# Patient Record
Sex: Female | Born: 1960 | Race: White | Hispanic: No | Marital: Married | Smoking: Former smoker
Health system: Southern US, Community
[De-identification: ages and names within clinical notes are randomized; demographics above are authoritative.]

## PROBLEM LIST (undated history)

## (undated) DIAGNOSIS — N390 Urinary tract infection, site not specified: Secondary | ICD-10-CM

## (undated) DIAGNOSIS — R109 Unspecified abdominal pain: Secondary | ICD-10-CM

## (undated) DIAGNOSIS — E079 Disorder of thyroid, unspecified: Secondary | ICD-10-CM

## (undated) DIAGNOSIS — K430 Incisional hernia with obstruction, without gangrene: Secondary | ICD-10-CM

## (undated) DIAGNOSIS — K56609 Unspecified intestinal obstruction, unspecified as to partial versus complete obstruction: Secondary | ICD-10-CM

## (undated) DIAGNOSIS — F419 Anxiety disorder, unspecified: Secondary | ICD-10-CM

## (undated) HISTORY — DX: Urinary tract infection, site not specified: N39.0

## (undated) HISTORY — PX: BLADDER SUSPENSION: SHX72

## (undated) HISTORY — PX: HERNIA REPAIR: SHX51

## (undated) HISTORY — DX: Unspecified abdominal pain: R10.9

## (undated) HISTORY — PX: ABDOMINAL SURGERY: SHX537

---

## 2001-05-15 ENCOUNTER — Other Ambulatory Visit: Admission: RE | Admit: 2001-05-15 | Discharge: 2001-05-15 | Payer: Self-pay | Admitting: Family Medicine

## 2003-01-28 ENCOUNTER — Other Ambulatory Visit: Admission: RE | Admit: 2003-01-28 | Discharge: 2003-01-28 | Payer: Self-pay | Admitting: Family Medicine

## 2003-11-08 ENCOUNTER — Encounter: Admission: RE | Admit: 2003-11-08 | Discharge: 2003-11-08 | Payer: Self-pay | Admitting: Obstetrics and Gynecology

## 2005-01-14 HISTORY — PX: ABDOMINAL HYSTERECTOMY: SHX81

## 2005-03-28 ENCOUNTER — Other Ambulatory Visit: Admission: RE | Admit: 2005-03-28 | Discharge: 2005-03-28 | Payer: Self-pay | Admitting: Obstetrics and Gynecology

## 2005-08-20 ENCOUNTER — Inpatient Hospital Stay (HOSPITAL_COMMUNITY): Admission: RE | Admit: 2005-08-20 | Discharge: 2005-09-03 | Payer: Self-pay | Admitting: Obstetrics and Gynecology

## 2005-08-26 ENCOUNTER — Encounter: Payer: Self-pay | Admitting: General Surgery

## 2008-01-02 IMAGING — US IR US GUIDE VASC ACCESS RIGHT
1 series · 1 of 1 positions shown · non-contrast
Comparison: none

CLINICAL DATA: Small bowel obstruction.  PICC has been requested for TNA.
 PICC PLACEMENT WITH FLUOROSCOPIC AND ULTRASOUND GUIDANCE:
TECHNIQUE: The right arm was prepped with Betadine, draped in the usual sterile fashion, and infiltrated locally with 1% lidocaine.  Ultrasound demonstrated patency of the right basilic vein.  Under real-time ultrasound guidance, this vein was accessed with a 21-gauge micropuncture needle.  The needle was exchanged over a guidewire for a peel-away sheath, through which a 5 French dual lumen PICC catheter trimmed to 37 cm was advanced, positioned with its tip at the distal SVC/right atrial junction.  Fluoroscopic spot chest radiograph confirms appropriate catheter tip position.  The catheter was flushed, secured to the skin with Prolene sutures, and covered with a sterile dressing.  No immediate complication.

[Series 1: sp us guide vasc access*right* · 1 of 1 slices shown]
[im 1/1]
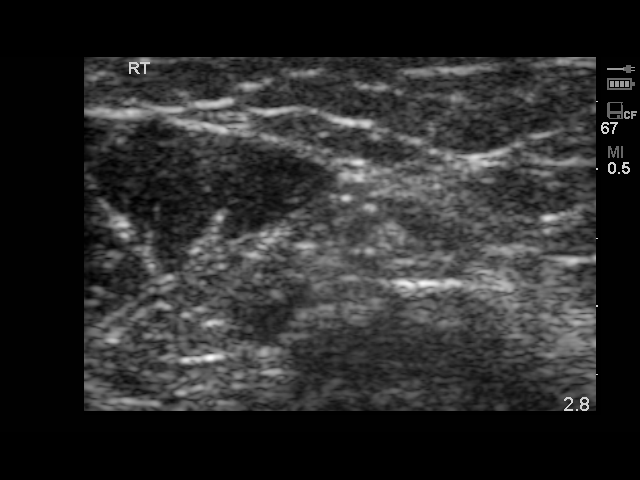

[1 of 1 positions shown; findings below may reference images not displayed]

IMPRESSION: Technically successful right arm PICC placement with ultrasound.  Ready for routine use.

## 2010-01-14 HISTORY — PX: APPENDECTOMY: SHX54

## 2010-02-04 ENCOUNTER — Encounter: Payer: Self-pay | Admitting: Family Medicine

## 2010-11-20 ENCOUNTER — Ambulatory Visit (INDEPENDENT_AMBULATORY_CARE_PROVIDER_SITE_OTHER)
Admission: RE | Admit: 2010-11-20 | Discharge: 2010-11-20 | Disposition: A | Discharge status: Home / Self Care | Payer: 59 | Source: Ambulatory Visit | Attending: Family Medicine | Admitting: Family Medicine

## 2010-11-20 ENCOUNTER — Other Ambulatory Visit (HOSPITAL_BASED_OUTPATIENT_CLINIC_OR_DEPARTMENT_OTHER): Payer: Self-pay | Admitting: Family Medicine

## 2010-11-20 DIAGNOSIS — R110 Nausea: Secondary | ICD-10-CM

## 2010-11-20 DIAGNOSIS — K7689 Other specified diseases of liver: Secondary | ICD-10-CM

## 2010-11-20 DIAGNOSIS — R509 Fever, unspecified: Secondary | ICD-10-CM

## 2010-11-20 DIAGNOSIS — R1084 Generalized abdominal pain: Secondary | ICD-10-CM

## 2010-11-20 MED ORDER — IOHEXOL 300 MG/ML  SOLN
100.0000 mL | Freq: Once | INTRAMUSCULAR | Status: AC | PRN
  Administered 2010-11-20: 100 mL via INTRAVENOUS

## 2010-11-21 ENCOUNTER — Encounter (HOSPITAL_COMMUNITY): Payer: Self-pay

## 2010-11-21 ENCOUNTER — Emergency Department (HOSPITAL_COMMUNITY): Payer: 59 | Admitting: Anesthesiology

## 2010-11-21 ENCOUNTER — Inpatient Hospital Stay (HOSPITAL_COMMUNITY)
Admission: EM | Admit: 2010-11-21 | Discharge: 2010-11-22 | DRG: 343 | Disposition: A | Discharge status: Home / Self Care | Payer: 59 | Attending: Surgery | Admitting: Surgery

## 2010-11-21 ENCOUNTER — Encounter (HOSPITAL_COMMUNITY): Payer: Self-pay | Admitting: Anesthesiology

## 2010-11-21 ENCOUNTER — Telehealth (INDEPENDENT_AMBULATORY_CARE_PROVIDER_SITE_OTHER): Payer: Self-pay | Admitting: General Surgery

## 2010-11-21 ENCOUNTER — Other Ambulatory Visit (INDEPENDENT_AMBULATORY_CARE_PROVIDER_SITE_OTHER): Payer: Self-pay | Admitting: General Surgery

## 2010-11-21 ENCOUNTER — Encounter (HOSPITAL_COMMUNITY)
Admission: EM | Disposition: A | Discharge status: Home / Self Care | Payer: Self-pay | Source: Home / Self Care | Attending: General Surgery

## 2010-11-21 ENCOUNTER — Encounter (HOSPITAL_COMMUNITY): Payer: Self-pay | Admitting: General Surgery

## 2010-11-21 DIAGNOSIS — K3580 Unspecified acute appendicitis: Principal | ICD-10-CM | POA: Diagnosis present

## 2010-11-21 DIAGNOSIS — E039 Hypothyroidism, unspecified: Secondary | ICD-10-CM | POA: Diagnosis present

## 2010-11-21 DIAGNOSIS — K37 Unspecified appendicitis: Secondary | ICD-10-CM | POA: Diagnosis present

## 2010-11-21 DIAGNOSIS — R1031 Right lower quadrant pain: Secondary | ICD-10-CM

## 2010-11-21 DIAGNOSIS — F411 Generalized anxiety disorder: Secondary | ICD-10-CM | POA: Diagnosis present

## 2010-11-21 HISTORY — DX: Anxiety disorder, unspecified: F41.9

## 2010-11-21 HISTORY — PX: LAPAROSCOPIC APPENDECTOMY: SHX408

## 2010-11-21 HISTORY — DX: Disorder of thyroid, unspecified: E07.9

## 2010-11-21 HISTORY — DX: Incisional hernia with obstruction, without gangrene: K43.0

## 2010-11-21 HISTORY — DX: Unspecified intestinal obstruction, unspecified as to partial versus complete obstruction: K56.609

## 2010-11-21 SURGERY — APPENDECTOMY, LAPAROSCOPIC
Anesthesia: General | Site: Abdomen | Wound class: Contaminated

## 2010-11-21 MED ORDER — PROMETHAZINE HCL 25 MG/ML IJ SOLN
6.2500 mg | INTRAMUSCULAR | Status: DC | PRN
  Filled 2010-11-21: qty 1

## 2010-11-21 MED ORDER — RINGERS IRRIGATION IR SOLN
Status: DC | PRN
  Administered 2010-11-21: 1000 mL

## 2010-11-21 MED ORDER — LIDOCAINE HCL (CARDIAC) 20 MG/ML IV SOLN
INTRAVENOUS | Status: DC | PRN
  Administered 2010-11-21: 100 mg via INTRAVENOUS

## 2010-11-21 MED ORDER — HYDROMORPHONE HCL PF 1 MG/ML IJ SOLN
0.2500 mg | INTRAMUSCULAR | Status: DC | PRN
  Administered 2010-11-21: 0.5 mg via INTRAVENOUS

## 2010-11-21 MED ORDER — ONDANSETRON HCL 4 MG/2ML IJ SOLN
4.0000 mg | Freq: Four times a day (QID) | INTRAMUSCULAR | Status: DC | PRN
  Administered 2010-11-21 – 2010-11-22 (×2): 4 mg via INTRAVENOUS
  Filled 2010-11-21 (×3): qty 2

## 2010-11-21 MED ORDER — CISATRACURIUM BESYLATE 2 MG/ML IV SOLN
INTRAVENOUS | Status: DC | PRN
  Administered 2010-11-21: 5 mg via INTRAVENOUS

## 2010-11-21 MED ORDER — SODIUM CHLORIDE 0.9 % IR SOLN
Status: DC | PRN
  Administered 2010-11-21: 1000 mL

## 2010-11-21 MED ORDER — MIDAZOLAM HCL 5 MG/5ML IJ SOLN
INTRAMUSCULAR | Status: DC | PRN
  Administered 2010-11-21: 2 mg via INTRAVENOUS

## 2010-11-21 MED ORDER — LACTATED RINGERS IV SOLN
INTRAVENOUS | Status: DC | PRN
  Administered 2010-11-21 (×2): via INTRAVENOUS

## 2010-11-21 MED ORDER — ACETAMINOPHEN 325 MG PO TABS: 650.0000 mg | ORAL_TABLET | Freq: Four times a day (QID) | ORAL | Status: DC | PRN

## 2010-11-21 MED ORDER — SODIUM CHLORIDE 0.9 % IV SOLN
3.0000 g | Freq: Once | INTRAVENOUS | Status: AC
  Administered 2010-11-21: 3 g via INTRAVENOUS
  Filled 2010-11-21: qty 3

## 2010-11-21 MED ORDER — BUPIVACAINE HCL (PF) 0.5 % IJ SOLN
INTRAMUSCULAR | Status: DC | PRN
  Administered 2010-11-21: 15 mL

## 2010-11-21 MED ORDER — SUFENTANIL CITRATE 50 MCG/ML IV SOLN
INTRAVENOUS | Status: DC | PRN
  Administered 2010-11-21: 20 ug via INTRAVENOUS
  Administered 2010-11-21: 5 ug via INTRAVENOUS

## 2010-11-21 MED ORDER — HYDROMORPHONE HCL PF 1 MG/ML IJ SOLN
INTRAMUSCULAR | Status: AC
  Filled 2010-11-21: qty 1

## 2010-11-21 MED ORDER — SUCCINYLCHOLINE CHLORIDE 20 MG/ML IJ SOLN
INTRAMUSCULAR | Status: DC | PRN
  Administered 2010-11-21: 80 mg via INTRAVENOUS

## 2010-11-21 MED ORDER — MORPHINE SULFATE 2 MG/ML IJ SOLN
1.0000 mg | INTRAMUSCULAR | Status: DC | PRN
  Administered 2010-11-21 – 2010-11-22 (×3): 2 mg via INTRAVENOUS
  Filled 2010-11-21 (×3): qty 1

## 2010-11-21 MED ORDER — KCL IN DEXTROSE-NACL 20-5-0.45 MEQ/L-%-% IV SOLN
INTRAVENOUS | Status: DC
  Administered 2010-11-21 – 2010-11-22 (×2): via INTRAVENOUS
  Filled 2010-11-21 (×6): qty 1000

## 2010-11-21 MED ORDER — GLYCOPYRROLATE 0.2 MG/ML IJ SOLN
INTRAMUSCULAR | Status: DC | PRN
  Administered 2010-11-21: .5 mg via INTRAVENOUS

## 2010-11-21 MED ORDER — ACETAMINOPHEN 650 MG RE SUPP: 650.0000 mg | Freq: Four times a day (QID) | RECTAL | Status: DC | PRN

## 2010-11-21 MED ORDER — PANTOPRAZOLE SODIUM 40 MG IV SOLR
40.0000 mg | Freq: Every day | INTRAVENOUS | Status: DC
  Filled 2010-11-21: qty 40

## 2010-11-21 MED ORDER — ACETAMINOPHEN 10 MG/ML IV SOLN
INTRAVENOUS | Status: AC
  Filled 2010-11-21: qty 100

## 2010-11-21 MED ORDER — BUPIVACAINE HCL (PF) 0.5 % IJ SOLN
INTRAMUSCULAR | Status: AC
  Filled 2010-11-21: qty 30

## 2010-11-21 MED ORDER — ACETAMINOPHEN 10 MG/ML IV SOLN
INTRAVENOUS | Status: DC | PRN
  Administered 2010-11-21: 1000 mg via INTRAVENOUS

## 2010-11-21 MED ORDER — NEOSTIGMINE METHYLSULFATE 1 MG/ML IJ SOLN
INTRAMUSCULAR | Status: DC | PRN
  Administered 2010-11-21: 3.5 mg via INTRAVENOUS

## 2010-11-21 MED ORDER — PROPOFOL 10 MG/ML IV EMUL
INTRAVENOUS | Status: DC | PRN
  Administered 2010-11-21: 170 mg via INTRAVENOUS

## 2010-11-21 MED ORDER — EPHEDRINE SULFATE 50 MG/ML IJ SOLN
INTRAMUSCULAR | Status: DC | PRN
  Administered 2010-11-21 (×2): 10 mg via INTRAVENOUS

## 2010-11-21 MED ORDER — ONDANSETRON HCL 4 MG/2ML IJ SOLN
INTRAMUSCULAR | Status: DC | PRN
  Administered 2010-11-21: 4 mg via INTRAVENOUS

## 2010-11-21 SURGICAL SUPPLY — 51 items
APL SKNCLS STERI-STRIP NONHPOA (GAUZE/BANDAGES/DRESSINGS) ×1
APPLIER CLIP 5 13 M/L LIGAMAX5 (MISCELLANEOUS)
APPLIER CLIP ROT 10 11.4 M/L (STAPLE)
APR CLP MED LRG 11.4X10 (STAPLE)
APR CLP MED LRG 5 ANG JAW (MISCELLANEOUS)
BAG SPEC RTRVL LRG 6X4 10 (ENDOMECHANICALS) ×1
BENZOIN TINCTURE PRP APPL 2/3 (GAUZE/BANDAGES/DRESSINGS) ×2 IMPLANT
CANISTER SUCTION 2500CC (MISCELLANEOUS) ×2 IMPLANT
CHLORAPREP W/TINT 26ML (MISCELLANEOUS) ×2 IMPLANT
CLIP APPLIE 5 13 M/L LIGAMAX5 (MISCELLANEOUS) IMPLANT
CLIP APPLIE ROT 10 11.4 M/L (STAPLE) IMPLANT
CLOTH BEACON ORANGE TIMEOUT ST (SAFETY) ×2 IMPLANT
CUTTER FLEX LINEAR 45M (STAPLE) ×1 IMPLANT
DECANTER SPIKE VIAL GLASS SM (MISCELLANEOUS) ×2 IMPLANT
DEVICE TROCAR PUNCTURE CLOSURE (ENDOMECHANICALS) ×1 IMPLANT
DRAPE LAPAROSCOPIC ABDOMINAL (DRAPES) ×2 IMPLANT
DRAPE UTILITY XL STRL (DRAPES) ×2 IMPLANT
DRSG TEGADERM 2-3/8X2-3/4 SM (GAUZE/BANDAGES/DRESSINGS) ×10 IMPLANT
DRSG TEGADERM 2.38X2.75 (GAUZE/BANDAGES/DRESSINGS) ×4 IMPLANT
ELECT REM PT RETURN 9FT ADLT (ELECTROSURGICAL) ×2
ELECTRODE REM PT RTRN 9FT ADLT (ELECTROSURGICAL) ×1 IMPLANT
ENDOLOOP SUT PDS II  0 18 (SUTURE)
ENDOLOOP SUT PDS II 0 18 (SUTURE) IMPLANT
GAUZE SPONGE 2X2 8PLY STRL LF (GAUZE/BANDAGES/DRESSINGS) IMPLANT
GLOVE BIOGEL PI IND STRL 7.0 (GLOVE) ×1 IMPLANT
GLOVE BIOGEL PI INDICATOR 7.0 (GLOVE) ×1
GLOVE ECLIPSE 8.0 STRL XLNG CF (GLOVE) ×2 IMPLANT
GLOVE INDICATOR 8.0 STRL GRN (GLOVE) ×4 IMPLANT
GOWN STRL NON-REIN LRG LVL3 (GOWN DISPOSABLE) ×2 IMPLANT
GOWN STRL REIN XL XLG (GOWN DISPOSABLE) ×3 IMPLANT
KIT BASIN OR (CUSTOM PROCEDURE TRAY) ×2 IMPLANT
PENCIL BUTTON HOLSTER BLD 10FT (ELECTRODE) ×2 IMPLANT
POUCH SPECIMEN RETRIEVAL 10MM (ENDOMECHANICALS) ×1 IMPLANT
RELOAD 45 VASCULAR/THIN (ENDOMECHANICALS) IMPLANT
RELOAD STAPLE 45 2.5 WHT GRN (ENDOMECHANICALS) IMPLANT
RELOAD STAPLE 45 3.5 BLU ETS (ENDOMECHANICALS) IMPLANT
RELOAD STAPLE TA45 3.5 REG BLU (ENDOMECHANICALS) ×2 IMPLANT
SCALPEL HARMONIC ACE (MISCELLANEOUS) ×1 IMPLANT
SCISSORS LAP 5X35 DISP (ENDOMECHANICALS) ×1 IMPLANT
SET IRRIG TUBING LAPAROSCOPIC (IRRIGATION / IRRIGATOR) ×2 IMPLANT
SOLUTION ANTI FOG 6CC (MISCELLANEOUS) ×2 IMPLANT
SPONGE GAUZE 2X2 STER 10/PKG (GAUZE/BANDAGES/DRESSINGS) ×1
STRIP CLOSURE SKIN 1/2X4 (GAUZE/BANDAGES/DRESSINGS) ×2 IMPLANT
SUT MNCRL AB 4-0 PS2 18 (SUTURE) ×2 IMPLANT
TOWEL OR 17X26 10 PK STRL BLUE (TOWEL DISPOSABLE) ×2 IMPLANT
TRAY FOLEY CATH 14FRSI W/METER (CATHETERS) ×2 IMPLANT
TRAY LAP CHOLE (CUSTOM PROCEDURE TRAY) ×2 IMPLANT
TROCAR BLADELESS OPT 5 75 (ENDOMECHANICALS) ×5 IMPLANT
TROCAR XCEL 12X100 BLDLESS (ENDOMECHANICALS) ×1 IMPLANT
TROCAR XCEL BLUNT TIP 100MML (ENDOMECHANICALS) IMPLANT
TUBING INSUFFLATION 10FT LAP (TUBING) ×2 IMPLANT

## 2010-11-21 NOTE — Transfer of Care (Signed)
Immediate Anesthesia Transfer of Care Note  Patient: Theresa Rich  Procedure(s) Performed:  APPENDECTOMY LAPAROSCOPIC  Patient Location: PACU  Anesthesia Type: General  Level of Consciousness: awake and oriented  Airway & Oxygen Therapy: Patient Spontanous Breathing and Patient connected to face mask oxygen  Post-op Assessment: Report given to PACU RN and Post -op Vital signs reviewed and stable  Post vital signs: Reviewed and stable  Complications: No apparent anesthesia complications

## 2010-11-21 NOTE — Progress Notes (Signed)
Presents to ED for eval by Surgeon Dr. Abbey Chatters.  Surgeon currently at bedside.

## 2010-11-21 NOTE — Anesthesia Preprocedure Evaluation (Addendum)
Anesthesia Evaluation  Patient identified by MRN, date of birth, ID band Patient awake    Reviewed: Allergy & Precautions, H&P , NPO status , Patient's Chart, lab work & pertinent test results  Airway Mallampati: III TM Distance: >3 FB Neck ROM: Full    Dental No notable dental hx. (+) Teeth Intact,    Pulmonary neg pulmonary ROS,  clear to auscultation  Pulmonary exam normal       Cardiovascular neg cardio ROS Regular Normal    Neuro/Psych Negative Neurological ROS  Negative Psych ROS   GI/Hepatic negative GI ROS, Neg liver ROS,   Endo/Other  Negative Endocrine ROSHypothyroidism   Renal/GU negative Renal ROS  Genitourinary negative   Musculoskeletal negative musculoskeletal ROS (+)   Abdominal (+) obese,   Peds negative pediatric ROS (+)  Hematology negative hematology ROS (+)   Anesthesia Other Findings   Reproductive/Obstetrics negative OB ROS                         Anesthesia Physical Anesthesia Plan  ASA: II  Anesthesia Plan: General   Post-op Pain Management:    Induction: Intravenous  Airway Management Planned: Oral ETT  Additional Equipment:   Intra-op Plan:   Post-operative Plan: Extubation in OR  Informed Consent: I have reviewed the patients History and Physical, chart, labs and discussed the procedure including the risks, benefits and alternatives for the proposed anesthesia with the patient or authorized representative who has indicated his/her understanding and acceptance.   Dental advisory given  Plan Discussed with: CRNA  Anesthesia Plan Comments:         Anesthesia Quick Evaluation

## 2010-11-21 NOTE — Preoperative (Signed)
Beta Blockers   Reason not to administer Beta Blockers:Not Applicable 

## 2010-11-21 NOTE — Telephone Encounter (Signed)
DR. Derrell Lolling CALLED AFTER SPEAKING WITH DR. Foy Guadalajara RE HAVING A PT SEEN IN URGENT OFFICE TODAY WITH APPENDICITIS. PT IS Theresa Rich. DR. Derrell Lolling WAS ADVISED THAT URGENT OFFICE IS FULL AND SUBSEQUENTLY DR. Biagio Quint CALLED TO SAY HE WOULD BE LATE TO OFFICE. NO INDICATION OF TIME EXPECTED. I REVIEWED CT REPORT WITH DR. Abbey Chatters [facility] AND HE SAID PT NEEDED TO BE SENT TO [place] EMERGENCY ROOM. SHANNON AT DR. Pablo Lawrence OFFICE [phone] NOTIFIED TO DIRECT PT TO [facility]. ER TRIAGE NOTIFIED OF PT'S ARRIVAL.

## 2010-11-21 NOTE — Anesthesia Postprocedure Evaluation (Signed)
  Anesthesia Post-op Note  Patient: Theresa Rich  Procedure(s) Performed:  APPENDECTOMY LAPAROSCOPIC  Patient Location: PACU  Anesthesia Type: General  Level of Consciousness: awake and alert   Airway and Oxygen Therapy: Patient Spontanous Breathing  Post-op Pain: mild  Post-op Assessment: Post-op Vital signs reviewed, Patient's Cardiovascular Status Stable, Respiratory Function Stable, Patent Airway and No signs of Nausea or vomiting  Post-op Vital Signs: stable  Complications: No apparent anesthesia complications

## 2010-11-21 NOTE — H&P (Signed)
[name] is an 50 y.o. female.   Chief Complaint:  HPI:   She began having diffuse abdominal pain approximately 6 days ago. This persisted and on Monday she was seen by her primary care physician. She's had blood work done on Friday which demonstrated Theresa normal white blood cell count and Theresa normal metabolic panel. There is no evidence of urinary tract infection. She underwent Theresa CT scan yesterday which demonstrated dilated appendix with some thickening and mild inflammatory changes consistent with early acute appendicitis. She's had little nausea and low-grade fever but no vomiting. She's not had anything like this before.  Past Medical History  Diagnosis Date  . Anxiety   . Thyroid disease     hypothyroid  . Incisional hernia, incarcerated     Past Surgical History  Procedure Date  . Abdominal hysterectomy   . Hernia repair     incisional with SB resection  . Hernia repair     umbilical as Theresa child  . Bladder suspension     with mesh  . Cesarean section     Medications Prior to Admission  Medication Dose Route Frequency Provider Last Rate Last Dose  . Ampicillin-Sulbactam (UNASYN) 3 g in sodium chloride 0.9 % 100 mL IVPB  3 g Intravenous Once [employer], PA      . dextrose 5 % and 0.45 % NaCl with KCl 20 mEq/L infusion   Intravenous Continuous Alisa Graff Bruning, PA      . iohexol (OMNIPAQUE) 300 MG/ML injection 100 mL  100 mL Intravenous Once PRN Medication Radiologist   100 mL at 11/20/10 1504  . morphine 2 MG/ML injection 1-4 mg  1-4 mg Intravenous Q3H PRN Marianna Fuss, PA      . ondansetron [facility]) injection 4 mg  4 mg Intravenous Q6H PRN Marianna Fuss, PA       No current outpatient prescriptions on file as of 11/21/2010.    Allergies:  Allergies  Allergen Reactions  . Reglan (Metoclopramide Hcl) Other (See Comments)    Felt like things were crawling inside of her.    Family History  Problem Relation Age of Onset  . Heart disease Mother   . Hypertension  Mother   . Heart disease Father   . Hypertension Father   . Hypertension Sister     Social History:  reports that she has quit smoking. She does not have Theresa Rich smokeless tobacco history on file. She reports that she does not drink alcohol. Her drug history not on file.  General:  __Normal _X_Fever__Weight change __Night sweats __Fatigue __Sleep loss  Breast:  _X_Normal __Lump __Pain __Nipple discharge __Infection __Other  Infectious Diseases:  _X_Normal __HIV/AIDS __Tuberculosis __Hepatitis Theresa       __ Hepatitis B __Hepatitis C __ STD __MRSA(staph infection) __Other  Dental:  _X_Normal __Dentures __Other  Cardiac  :_X_ Normal __Pacemaker __Defibrillator __Bypass surgery __High blood pressure __ Peripheral vascular disease __Heart attack __Irregular heart beat __Valve       disease  Pulmonary:  _X_Normal __Cough/Sputum __Bronchitis __Asthma __COPD                    __Short of breath __Pneumonia __Sleep Apnea  Endocrine:  _X_Normal __Diabetes __Thyroid disease __High Cholesterol __Other  Skin:  _X_Normal  __Rash __Bruise easily __Cancer __Abnormal moles __Other  Gastrointestinal:  __Normal __Nausea/Vomiting/Diarrhea __Colon polyp or Cancer       __Irritable Bowel disease __Poor appetite __Hiatal hernia or reflux __Ulcer  __Liver disease _X_Abdominal pain __Hernia __Rectal bleeding or hemorrhoids       __Other  Genitourinary:  _X_Normal __Kidney disease or stones __Prostate problems        __Blood in urine __Difficulty voiding __Incontinence/leakage __Other  Neurological:  _X_Normal __Stroke __Paralysis __Seizure __Alzheimers disease       __Headaches __Dizziness __Fainting __Weakness __Other  Hematologic/Lymphatic:  _X_Normal __Bleeding disorder __Blood clots __Anemia       __Swollen lymph nodes __Blood Transfusion __Other  Immune System:  _X_Normal __Previous/Current Cancer-type:  __Other  HEENT:  _X_Normal __Hearing loss  __Hearing aid __Ear infection __Nose bleed        __Hoarseness __Sore throat __Blurred or double vision __Glasses or contacts       __Glaucoma __Retinopathy __Macular degeneration __Other  Musculoskeletal:  _X_Normal __Joint pain __Arthritis __Other  @V @ General Appearance:  Alert, cooperative, no distress, appears stated age  Head:  Normocephalic, without obvious abnormality, atraumatic  Eyes:  Conjunctiva/corneas clear, EOM's intact      Nose: Nares normal, septum midline no drainage      Neck: Supple, symmetrical, no mass       Lungs:   Clear to auscultation bilaterally, respirations unlabored  Chest Wall:  No tenderness or deformity  Heart:  Regular rate and rhythm, S1, S2 normal, no murmur, rub or gallop  Abdomen:   Soft, mild rlq and suprapubic tenderness, bowel sounds active all four quadrants,  no masses, no organomegaly,transverse midline scars in midabd, lower transverse scar  GU:   no hernias      Extremities: Extremities normal, atraumatic, no cyanosis or edema     Skin: Skin color, texture, turgor normal, no rashes or lesions  Lymph nodes: Cervical, supraclavicular not enlarged  Neurologic: Nonfocal         No results found for this or Theresa Rich previous visit (from the past 48 hour(s)). Ct Abdomen Pelvis W Contrast  11/20/2010  *RADIOLOGY REPORT*  Clinical Data: Generalized abdominal pain, nausea, andfever.  CT ABDOMEN AND PELVIS WITH CONTRAST  Technique:  Multidetector CT imaging of the abdomen and pelvis was performed following the standard protocol during bolus administration of intravenous contrast.  Contrast: OMNIPAQUE IOHEXOL 300 MG/ML IV SOLN  Comparison: Abdominal radiographs 11/16/2010. CT abdomen pelvis 08/23/2005  Findings:  Probable focal atelectasis or scar in the right middle lobe, seen on the 1st image.  Negative for pleural effusion.  Diffuse fatty infiltration of the liver.  No focal hepatic mass or biliary ductal dilatation.  The gallbladder is contracted.  No calcified gallstones are seen.  The  spleen, adrenal glands, pancreas, and kidneys are within normal limits.  The ureters are normal in caliber.  Delayed images through the kidneys show symmetric excretion of contrast both kidneys.  There is no urinary tract obstruction.  Stomach is nondistended.  Small bowel loops are opacified with oral contrast  and normal in caliber and wall thickness.  The terminal ileum is normal. The colon and rectum are normal in caliber and wall thickness.  The cecum is positioned in the anterior aspect of the right pelvis. The appendix extends directly posterior to the cecum and measures up to 9 mm in caliber. The appendix takes Theresa straightened course. There is no air within the lumen of the appendix, and the appendiceal wall appears slightly thickened.  It is noted that the appendiceal caliber is approximately double what it was on the CT of 08/23/2005.  No definite periappendiceal fat inflammatory stranding.  There is Theresa tiny amount of free fluid  in the right aspect of the pelvis.  Urinary bladder is unremarkable.  Patient is status post hysterectomy.  There is no adnexal mass.  Visualized lower thoracic and lumbar spine vertebral bodies are normal in height and alignment.  No acute or suspicious bony abnormality.  There are postsurgical changes of the anterior abdominal wall in the midline from prior hernia repair.  There is no hernia currently.  Negative for lymphadenopathy, inflammatory changes in the mesentery, ascites, or free air.  IMPRESSION:  1.  Findings suspicious for early acute appendicitis.  Appendiceal diameter measures up to 9 mm.  No evidence of appendiceal rupture. Findings discussed with Dr. Foy Guadalajara by telephone 11/20/2010 at 4:00 pm. 2.  Fatty infiltration of the liver. 3.  Prior anterior abdominal wall hernia repair.  Original Report Authenticated By: Britta Mccreedy, M.D.         Assessment/Plan Early acute appendicitis  Plan:  Laparoscopic possible open appendectomy. The procedure risks and  aftercare were explained to her. Risks include but not limited to bleeding, infection, wound healing problems, anesthesia, accidental damage to intra-abdominal organs. She seems to understand all this and agrees with the plan. We'll start her on IV antibiotics.  Cristino Degroff J 11/21/2010, 4:32 PM

## 2010-11-21 NOTE — Op Note (Signed)
Operative Note  Theresa Rich female 50 y.o. 11/21/2010  PREOPERATIVE DX:  Early acute appendicitis   POSTOPERATIVE DX:  Same  PROCEDURE:  Laparoscopic appendectomy         Surgeon: Adolph Pollack   Assistants: None  Anesthesia: General endotracheal anesthesia  Indications:   This is a 50 year old female with a 5 day history of persistent abdominal pain.  The pain is in the lower abdomen and she is tender in the right lower quadrant area. She was sent for a CT scan yesterday and the findings were consistent with early acute appendicitis. She is in [facility] emergency room and we were asked to evaluate her.  She is now brought to the operating room for laparoscopic possible open appendectomy    Procedure:  She is brought to the operating room placed supine on the operating table and a general anesthetic was administered. The abdominal wall was sterilely prepped and draped. A Foley catheter had been inserted. She was placed in slight reverse Trendelenburg position. A 5 mm incision was made in the left subcostal area. Using a 5 mm Optiview trocar and laparoscope I gained access to the peritoneal cavity and a pneumoperitoneum was created. I visualized the area under the trocar and there was no evidence of bleeding or organ injury. Adhesions between the small bowel and central abdominal wall were noted. A 5 mm trocar is placed in the left lower quadrant incision. There is no diverticulitis. There is no purulent drainage present. The cecum was grasped and identified the appendix. This demonstrated some acute inflammatory changes at the distal portion. No perforation. A 5 mm trochar was placed in the right upper quadrant incision.  Another 5 mm trochar was placed in the lower midline.  The mesoappendix was grasped and the appendix was retracted anteriorly.  The mesentery of the appendix was divided with the harmonic scalpel down to the base of the cecum.  The 5 mm lower midline trocar was  replaced with a 12 mm trocar. The appendix was amputated off the cecum with the Endo GIA stapler. The staple line was solid without evidence of leak or bleeding.  The appendix was removed through the lower abdominal incision and the trocar replaced. The right lower quadrant and pelvis area was copiously irrigated. No bleeding or bowel injury was noted after 4 quadrant inspection.  The 12 mm trocar was removed and the fascial defect closed with 0 Vicryl suture. The remaining trochars were removed and the pneumoperitoneum was released. All skin incisions were closed with 4-0 Monocryl subcuticular stitches. Steri-Strips and sterile dressings were applied.   She tolerated the procedure well without apparent complications and was taken to the recovery room in satisfactory condition.   Estimated Blood Loss:  Minimal         Drains: none          Blood Given: none          Specimens: appendix        Complications:  * No complications entered in OR log *         Disposition: PACU - hemodynamically stable.         Condition: stable

## 2010-11-22 MED ORDER — ESTRADIOL 0.1 MG/24HR TD PTTW: 1.0000 | MEDICATED_PATCH | TRANSDERMAL | Status: AC

## 2010-11-22 MED ORDER — ROSUVASTATIN CALCIUM 10 MG PO TABS: 10.0000 mg | ORAL_TABLET | Freq: Every day | ORAL | Status: AC

## 2010-11-22 MED ORDER — HYDROCODONE-ACETAMINOPHEN 5-325 MG PO TABS: 1.0000 | ORAL_TABLET | ORAL | Status: AC | PRN

## 2010-11-22 MED ORDER — ASPIRIN 81 MG PO TABS: 81.0000 mg | ORAL_TABLET | Freq: Every day | ORAL | Status: AC

## 2010-11-22 MED ORDER — ESCITALOPRAM OXALATE 20 MG PO TABS: 20.0000 mg | ORAL_TABLET | Freq: Every day | ORAL | Status: AC

## 2010-11-22 MED ORDER — MORPHINE SULFATE 4 MG/ML IJ SOLN
INTRAMUSCULAR | Status: AC
  Administered 2010-11-22: 2 mg
  Filled 2010-11-22: qty 1

## 2010-11-22 MED ORDER — LEVOTHYROXINE SODIUM 75 MCG PO TABS: 75.0000 ug | ORAL_TABLET | Freq: Every day | ORAL | Status: AC

## 2010-11-22 NOTE — Progress Notes (Signed)
Pt seen and examined.  Agree with KB's note. 

## 2010-11-22 NOTE — Discharge Summary (Signed)
Physician Discharge Summary  Patient ID: Theresa Rich MRN: [id number] DOB/AGE: June 30, 1960 50 y.o.  Admit date: 11/21/2010 Discharge date: 11/22/2010  Admission Diagnoses:Acute appendicitis  Discharge Diagnoses:  Active Problems:  Appendicitis s/p lap appy   Discharged Condition: good  Hospital Course: Uncomplicated. After dx of appendicitis by CT, admitted on 11-7. Taken to OR 11-7 for lap appy. No significant post op issues encountered.  Consults: none  Significant Diagnostic Studies: None  Treatments: surgery: Lap appy 11-7  Discharge Exam: Blood pressure 120/77, pulse 63, temperature 97.8 F (36.6 C), temperature source Oral, resp. rate 18, height 5\' 2"  (1.575 m), weight 178 lb (80.74 kg), SpO2 97.00%. Lungs: CTA without w/r/r Heart: Regular Abdomen: soft, ND, appropriately tender   Incisions all c/d/i without erythema or hematoma. Ext: No edema or tenderness   Disposition:   Discharge Orders    Future Orders Please Complete By Expires   Diet - low sodium heart healthy      Increase activity slowly      Discharge instructions      Comments:   [facility] ______[facility] LAPAROSCOPIC SURGERY: POST OP INSTRUCTIONS Always review your discharge instruction sheet given to you by the facility where your surgery was performed. IF YOU HAVE DISABILITY OR FAMILY LEAVE FORMS, YOU MUST BRING THEM TO THE OFFICE FOR PROCESSING.   DO NOT GIVE THEM TO YOUR DOCTOR.  A prescription for pain medication may be given to you upon discharge.  Take your pain medication as prescribed, if needed.  If narcotic pain medicine is not needed, then you may take acetaminophen (Tylenol) or ibuprofen (Advil) as needed. Take your usually prescribed medications unless otherwise directed. If you need a refill on your pain medication, please contact your pharmacy.  They will contact our office to request authorization. Prescriptions will not be filled after 5pm or on week-ends. You should  follow a light diet the first few days after arrival home, such as soup and crackers, etc.  Be sure to include lots of fluids daily. Most patients will experience some swelling and bruising in the area of the incisions.  Ice packs will help.  Swelling and bruising can take several days to resolve.  It is common to experience some constipation if taking pain medication after surgery.  Increasing fluid intake and taking a stool softener (such as Colace) will usually help or prevent this problem from occurring.  A mild laxative (Milk of Magnesia or Miralax) should be taken according to package instructions if there are no bowel movements after 48 hours. Unless discharge instructions indicate otherwise, you may remove your bandages 24-48 hours after surgery, and you may shower at that time.  You may have steri-strips (small skin tapes) in place directly over the incision.  These strips should be left on the skin for 7-10 days.  If your surgeon used skin glue on the incision, you may shower in 24 hours.  The glue will flake off over the next 2-3 weeks.  Any sutures or staples will be removed at the office during your follow-up visit. ACTIVITIES:  You may resume regular (light) daily activities beginning the next day-such as daily self-care, walking, climbing stairs-gradually increasing activities as tolerated.  You may have sexual intercourse when it is comfortable.  Refrain from any heavy lifting or straining until approved by your doctor. You may drive when you are no longer taking prescription pain medication, you can comfortably wear a seatbelt, and you can safely maneuver your car and apply brakes.  RETURN TO WORK:  __________________________________________________________ Theresa Rich should see your doctor in the office for a follow-up appointment approximately 2-3 weeks after your surgery.  Make sure that you call for this appointment within a day or two after you arrive home to insure a convenient appointment  time. OTHER INSTRUCTIONS: __________________________________________________________________________________________________________________________ __________________________________________________________________________________________________________________________ WHEN TO CALL YOUR DOCTOR: Fever over 101.0 Inability to urinate Continued bleeding from incision. Increased pain, redness, or drainage from the incision. Increasing abdominal pain  The clinic staff is available to answer your questions during regular business hours.  Please don't hesitate to call and ask to speak to one of the nurses for clinical concerns.  If you have a medical emergency, go to the nearest emergency room or call 911.  A surgeon from [facility] is always on call at the hospital. [address], [place], [place]  [address] ? [address] [address], [place], [place]   [address] [phone] ? [phone] ? FAX [phone] Web site: [email or web address]    Lifting restrictions      Comments:   No heavy lifting for 2 weeks.   Other Restrictions      Comments:   May shower, no tub baths   Remove dressing in 24 hours      Comments:   Romove plastic and gauze, steristrip will remain     Current Discharge Medication List    START taking these medications   Details  HYDROcodone-acetaminophen (NORCO) 5-325 MG per tablet Take 1-2 tablets by mouth every 4 (four) hours as needed for pain. Qty: 30 tablet, Refills: 0      CONTINUE these medications which have CHANGED   Details  aspirin 81 MG tablet Take 1 tablet (81 mg total) by mouth daily. Qty: 30 tablet, Refills: 0    escitalopram (LEXAPRO) 20 MG tablet Take 1 tablet (20 mg total) by mouth daily. Qty: 30 tablet, Refills: 0    estradiol (VIVELLE-DOT) 0.1 MG/24HR Place 1 patch (0.1 mg total) onto the skin 2 (two) times a week. Qty: 8 patch, Refills: 0    levothyroxine (SYNTHROID, LEVOTHROID) 75 MCG tablet Take 1 tablet  (75 mcg total) by mouth daily. Qty: 30 tablet, Refills: 0    rosuvastatin (CRESTOR) 10 MG tablet Take 1 tablet (10 mg total) by mouth daily. Qty: 30 tablet, Refills: 0       Follow-up Information    Follow up with Marianna Fuss, PA on 12/04/2010. (2:50pm)    Contact information:   [facility] [address] [place] [address] [phone]          Signed: Marianna Fuss 11/22/2010, 8:22 AM

## 2010-11-22 NOTE — Plan of Care (Signed)
Problem: Phase I Progression Outcomes Goal: OOB as tolerated unless otherwise ordered Outcome: Completed/Met Date Met:  11/22/10 Pt ambulated in hallway without difficulty Goal: Incision/dressings dry and intact Outcome: Completed/Met Date Met:  11/22/10 Some old drainage no new drainage at this time Goal: Other Phase I Outcomes/Goals Outcome: Completed/Met Date Met:  11/22/10 Pt has voided and tolerated diet post lap appy, patient has ambulated

## 2010-11-22 NOTE — Progress Notes (Signed)
1 Day Post-Op  Subjective: Feeling well. Sore as expected. No N/V but hasn't eaten much yet. Voiding well. Has been OOB already today  Objective: Vital signs in last 24 hours: Temp:  [97.3 F (36.3 C)-98.9 F (37.2 C)] 97.8 F (36.6 C) (11/08 0600) Pulse Rate:  [63-99] 63  (11/08 0600) Resp:  [11-19] 18  (11/08 0600) BP: (108-173)/(58-91) 120/77 mmHg (11/08 0600) SpO2:  [95 %-99 %] 97 % (11/08 0600) Weight:  [178 lb (80.74 kg)] 178 lb (80.74 kg) (11/07 1643) Last BM Date: 11/20/10  Intake/Output this shift:    Physical Exam: Lungs: CTA without w/r/r Heart: Regular Abdomen: soft, ND, appropriately tender   Incisions all c/d/i without erythema or hematoma. Ext: No edema or tenderness   Studies/Results: Ct Abdomen Pelvis W Contrast  11/20/2010  *RADIOLOGY REPORT*  Clinical Data: Generalized abdominal pain, nausea, andfever.  CT ABDOMEN AND PELVIS WITH CONTRAST  Technique:  Multidetector CT imaging of the abdomen and pelvis was performed following the standard protocol during bolus administration of intravenous contrast.  Contrast: OMNIPAQUE IOHEXOL 300 MG/ML IV SOLN  Comparison: Abdominal radiographs 11/16/2010. CT abdomen pelvis 08/23/2005  Findings:  Probable focal atelectasis or scar in the right middle lobe, seen on the 1st image.  Negative for pleural effusion.  Diffuse fatty infiltration of the liver.  No focal hepatic mass or biliary ductal dilatation.  The gallbladder is contracted.  No calcified gallstones are seen.  The spleen, adrenal glands, pancreas, and kidneys are within normal limits.  The ureters are normal in caliber.  Delayed images through the kidneys show symmetric excretion of contrast both kidneys.  There is no urinary tract obstruction.  Stomach is nondistended.  Small bowel loops are opacified with oral contrast  and normal in caliber and wall thickness.  The terminal ileum is normal. The colon and rectum are normal in caliber and wall thickness.  The cecum  is positioned in the anterior aspect of the right pelvis. The appendix extends directly posterior to the cecum and measures up to 9 mm in caliber. The appendix takes a straightened course. There is no air within the lumen of the appendix, and the appendiceal wall appears slightly thickened.  It is noted that the appendiceal caliber is approximately double what it was on the CT of 08/23/2005.  No definite periappendiceal fat inflammatory stranding.  There is a tiny amount of free fluid in the right aspect of the pelvis.  Urinary bladder is unremarkable.  Patient is status post hysterectomy.  There is no adnexal mass.  Visualized lower thoracic and lumbar spine vertebral bodies are normal in height and alignment.  No acute or suspicious bony abnormality.  There are postsurgical changes of the anterior abdominal wall in the midline from prior hernia repair.  There is no hernia currently.  Negative for lymphadenopathy, inflammatory changes in the mesentery, ascites, or free air.  IMPRESSION:  1.  Findings suspicious for early acute appendicitis.  Appendiceal diameter measures up to 9 mm.  No evidence of appendiceal rupture. Findings discussed with Dr. Foy Guadalajara by telephone 11/20/2010 at 4:00 pm. 2.  Fatty infiltration of the liver. 3.  Prior anterior abdominal wall hernia repair.  Original Report Authenticated By: Britta Mccreedy, M.D.     Assessment: Active Problems:  Appendicitis s/p Procedure(s): APPENDECTOMY LAPAROSCOPIC  Plan: DC home today if tolerates reg diet well.  LOS: 1 day    Marianna Fuss 11/22/2010

## 2010-11-22 NOTE — Addendum Note (Signed)
Addendum  created 11/22/10 0510 by Darci Needle. Lotus Gover   Modules edited:Anesthesia LDA

## 2010-11-22 NOTE — ED Provider Notes (Signed)
History     CSN: [id number] Arrival date & time: 11/21/2010  3:35 PM   First MD Initiated Contact with Patient 11/21/10 1557      Chief Complaint  Patient presents with  . Abdominal Pain    ?appy, CT yesterday, call Dr. Abbey Chatters    (Consider location/radiation/quality/duration/timing/severity/associated sxs/prior treatment) HPI  Past Medical History  Diagnosis Date  . Anxiety   . Thyroid disease     hypothyroid  . Incisional hernia, incarcerated   . Bowel obstruction     Past Surgical History  Procedure Date  . Abdominal hysterectomy   . Hernia repair     incisional with SB resection  . Hernia repair     umbilical as a child  . Bladder suspension     with mesh  . Cesarean section   . Abdominal surgery     Family History  Problem Relation Age of Onset  . Heart disease Mother   . Hypertension Mother   . Heart disease Father   . Hypertension Father   . Hypertension Sister     History  Substance Use Topics  . Smoking status: Former Games developer  . Smokeless tobacco: Former Neurosurgeon    Quit date: 11/20/2005  . Alcohol Use: No    OB History    Grav Para Term Preterm Abortions TAB SAB Ect Mult Living                  Review of Systems  Allergies  Reglan  Home Medications  No current outpatient prescriptions on file.  BP 108/68  Pulse 73  Temp(Src) 97.6 F (36.4 C) (Oral)  Resp 16  Ht 5\' 2"  (1.575 m)  Wt 178 lb (80.74 kg)  BMI 32.56 kg/m2  SpO2 97%  Physical Exam  ED Course  Procedures (including critical care time)   Labs Reviewed  POCT CBG MONITORING  POCT CBG MONITORING  POCT CBG MONITORING  POCT CBG MONITORING   Ct Abdomen Pelvis W Contrast  11/20/2010  *RADIOLOGY REPORT*  Clinical Data: Generalized abdominal pain, nausea, andfever.  CT ABDOMEN AND PELVIS WITH CONTRAST  Technique:  Multidetector CT imaging of the abdomen and pelvis was performed following the standard protocol during bolus administration of intravenous contrast.   Contrast: OMNIPAQUE IOHEXOL 300 MG/ML IV SOLN  Comparison: Abdominal radiographs 11/16/2010. CT abdomen pelvis 08/23/2005  Findings:  Probable focal atelectasis or scar in the right middle lobe, seen on the 1st image.  Negative for pleural effusion.  Diffuse fatty infiltration of the liver.  No focal hepatic mass or biliary ductal dilatation.  The gallbladder is contracted.  No calcified gallstones are seen.  The spleen, adrenal glands, pancreas, and kidneys are within normal limits.  The ureters are normal in caliber.  Delayed images through the kidneys show symmetric excretion of contrast both kidneys.  There is no urinary tract obstruction.  Stomach is nondistended.  Small bowel loops are opacified with oral contrast  and normal in caliber and wall thickness.  The terminal ileum is normal. The colon and rectum are normal in caliber and wall thickness.  The cecum is positioned in the anterior aspect of the right pelvis. The appendix extends directly posterior to the cecum and measures up to 9 mm in caliber. The appendix takes a straightened course. There is no air within the lumen of the appendix, and the appendiceal wall appears slightly thickened.  It is noted that the appendiceal caliber is approximately double what it was on the CT of  08/23/2005.  No definite periappendiceal fat inflammatory stranding.  There is a tiny amount of free fluid in the right aspect of the pelvis.  Urinary bladder is unremarkable.  Patient is status post hysterectomy.  There is no adnexal mass.  Visualized lower thoracic and lumbar spine vertebral bodies are normal in height and alignment.  No acute or suspicious bony abnormality.  There are postsurgical changes of the anterior abdominal wall in the midline from prior hernia repair.  There is no hernia currently.  Negative for lymphadenopathy, inflammatory changes in the mesentery, ascites, or free air.  IMPRESSION:  1.  Findings suspicious for early acute appendicitis.   Appendiceal diameter measures up to 9 mm.  No evidence of appendiceal rupture. Findings discussed with Dr. Foy Guadalajara by telephone 11/20/2010 at 4:00 pm. 2.  Fatty infiltration of the liver. 3.  Prior anterior abdominal wall hernia repair.  Original Report Authenticated By: Britta Mccreedy, M.D.     1. Appendicitis       MDM    Pt was seen by her Surgeon on her arrival to the ED.      [facility]     Laray Anger, DO 11/22/10 0410

## 2010-11-22 NOTE — Addendum Note (Signed)
Addendum  created 11/22/10 0510 by Jhane Lorio L. Kenya Kook   Modules edited:Anesthesia LDA    

## 2010-11-26 ENCOUNTER — Encounter (HOSPITAL_COMMUNITY): Payer: Self-pay | Admitting: General Surgery

## 2010-12-04 ENCOUNTER — Ambulatory Visit (INDEPENDENT_AMBULATORY_CARE_PROVIDER_SITE_OTHER): Payer: 59 | Admitting: Radiology

## 2010-12-04 ENCOUNTER — Encounter (INDEPENDENT_AMBULATORY_CARE_PROVIDER_SITE_OTHER): Payer: Self-pay | Admitting: Radiology

## 2010-12-04 VITALS — BP 116/74 | HR 60 | Temp 97.4°F | Resp 12 | Ht 62.0 in | Wt 178.4 lb

## 2010-12-04 DIAGNOSIS — K37 Unspecified appendicitis: Secondary | ICD-10-CM

## 2010-12-04 DIAGNOSIS — Z9889 Other specified postprocedural states: Secondary | ICD-10-CM

## 2010-12-04 DIAGNOSIS — Z9049 Acquired absence of other specified parts of digestive tract: Secondary | ICD-10-CM

## 2010-12-04 NOTE — Progress Notes (Signed)
Theresa Rich [facility] 06-10-1960 [id number] 12/04/2010   History of Present Illness: Theresa Rich is a  50 y.o. female who presents today status post lap appy.  Pathology reveals appendicitis.  The patient is tolerating a regular diet, having normal bowel movements, has good pain control.  She is back to most normal activities.   Physical Exam: Abd: soft, nontender, active bowel sounds, nondistended.  All incisions are well healed. No hematoma or infection  Impression: 1.  Acute appendicitis, s/p lap appy  Plan: She is able to return to normal activities. She  may follow up on a prn basis.

## 2011-07-03 ENCOUNTER — Encounter (INDEPENDENT_AMBULATORY_CARE_PROVIDER_SITE_OTHER): Payer: Self-pay | Admitting: General Surgery

## 2011-07-08 ENCOUNTER — Encounter (INDEPENDENT_AMBULATORY_CARE_PROVIDER_SITE_OTHER): Payer: Self-pay | Admitting: General Surgery

## 2011-07-08 ENCOUNTER — Ambulatory Visit (INDEPENDENT_AMBULATORY_CARE_PROVIDER_SITE_OTHER): Payer: 59 | Admitting: General Surgery

## 2011-07-08 VITALS — BP 136/84 | HR 72 | Temp 97.7°F | Resp 16 | Ht 62.0 in | Wt 184.2 lb

## 2011-07-08 DIAGNOSIS — R1032 Left lower quadrant pain: Secondary | ICD-10-CM | POA: Insufficient documentation

## 2011-07-08 NOTE — Progress Notes (Signed)
Patient ID: Theresa Rich, female   DOB: 1960-05-07, 51 y.o.   MRN: [id number]  Chief Complaint  Patient presents with  . Other    eval Left ing pain.... poss hernia    HPI Theresa Rich is a 51 y.o. female.  She is referred by Dr. Harold Hedge for evaluation of intermittent left groin pain. Her primary care physician is [place] [facility].  The patient has a two-month history of intermittent episodes of sharp left inguinal pain. This occurs when she lies on her stomach but does not bother her when she is ambulating. It does bother her sometimes she coughs or sneezes She has not felt a lump or bulge. There is no change in her GI habits. She says this initially was almost daily but now is getting better and occurs about every 2-3 days. They will last 15-20 minutes and will go away after she takes tylenol. She says the pain is sharp.  Past history is significant for some type of umbilical surgery at age 68 days. It is not clear whether this was a complex hernia or omphalocele. She had a laparoscopic-assisted vaginal hysterectomy, BSO and urethral sling in 2007. In the postop period she developed a strangulated ventral hernia at the umbilical trocar site and I performed a laparotomy and small bowel resection, for which she has  recovered. She underwent laparoscopic appendectomy by Dr. Abbey Chatters in November 2012. That was uneventful. She denies a history of orthopedic problems. She says she knows she has osteopenia. She has problems with obesity. She is hypothyroid and but in stable health. HPI  Past Medical History  Diagnosis Date  . Anxiety   . Thyroid disease     hypothyroid  . Incisional hernia, incarcerated   . Bowel obstruction   . Abdominal pain   . UTI (urinary tract infection)   . Osteoporosis     Past Surgical History  Procedure Date  . Bladder suspension     with mesh  . Abdominal surgery   . Laparoscopic appendectomy 11/21/2010    Procedure: APPENDECTOMY LAPAROSCOPIC;   Surgeon: Adolph Pollack, MD;  Location: [facility];  Service: General;  Laterality: N/A;  . Abdominal hysterectomy 2007  . Appendectomy 2012  . Hernia repair     incisional with SB resection  . Hernia repair 1960    umbilical as a child - [age] weeks of age  . Cesarean section 1990, 1995    Family History  Problem Relation Age of Onset  . Heart disease Mother   . Hypertension Mother   . Heart disease Father   . Hypertension Father   . Hypertension Sister   . Cancer Paternal Aunt     pt unaware of what kind    Social History History  Substance Use Topics  . Smoking status: Former Games developer  . Smokeless tobacco: Former Neurosurgeon    Quit date: 11/20/2005  . Alcohol Use: No    Allergies  Allergen Reactions  . Reglan (Metoclopramide Hcl) Other (See Comments)    Felt like things were crawling inside of her.    Current Outpatient Prescriptions  Medication Sig Dispense Refill  . aspirin 81 MG tablet Take 1 tablet (81 mg total) by mouth daily.  30 tablet  0  . escitalopram (LEXAPRO) 20 MG tablet Take 1 tablet (20 mg total) by mouth daily.  30 tablet  0  . estradiol (VIVELLE-DOT) 0.1 MG/24HR Place 1 patch (0.1 mg total) onto the skin 2 (two) times a  week.  8 patch  0  . levothyroxine (SYNTHROID, LEVOTHROID) 75 MCG tablet Take 1 tablet (75 mcg total) by mouth daily.  30 tablet  0  . rosuvastatin (CRESTOR) 10 MG tablet Take 1 tablet (10 mg total) by mouth daily.  30 tablet  0    Review of Systems Review of Systems  Constitutional: Negative for fever, chills and unexpected weight change.  HENT: Negative for hearing loss, congestion, sore throat, trouble swallowing and voice change.   Eyes: Negative for visual disturbance.  Respiratory: Negative for cough and wheezing.   Cardiovascular: Negative for chest pain, palpitations and leg swelling.  Gastrointestinal: Negative for nausea, vomiting, abdominal pain, diarrhea, constipation, blood in stool, abdominal distention and anal bleeding.    Genitourinary: Positive for decreased urine volume. Negative for hematuria, vaginal bleeding and difficulty urinating.  Musculoskeletal: Positive for back pain. Negative for arthralgias.  Skin: Negative for rash and wound.  Neurological: Negative for seizures, syncope and headaches.  Hematological: Negative for adenopathy. Does not bruise/bleed easily.  Psychiatric/Behavioral: Negative for confusion.    Blood pressure 136/84, pulse 72, temperature 97.7 F (36.5 C), temperature source Temporal, resp. rate 16, height 5\' 2"  (1.575 m), weight 184 lb 3.2 oz (83.553 kg).  Physical Exam Physical Exam  Constitutional: She is oriented to person, place, and time. She appears well-developed and well-nourished. No distress.  HENT:  Head: Normocephalic and atraumatic.  Nose: Nose normal.  Mouth/Throat: No oropharyngeal exudate.  Eyes: Conjunctivae and EOM are normal. Pupils are equal, round, and reactive to light. Left eye exhibits no discharge. No scleral icterus.  Neck: Neck supple. No JVD present. No tracheal deviation present. No thyromegaly present.  Cardiovascular: Normal rate, regular rhythm, normal heart sounds and intact distal pulses.   No murmur heard. Pulmonary/Chest: Effort normal and breath sounds normal. No respiratory distress. She has no wheezes. She has no rales. She exhibits no tenderness.  Abdominal: Soft. Bowel sounds are normal. She exhibits no distension and no mass. There is no tenderness. There is no rebound and no guarding.    Genitourinary:       No inguinal hernia, mass, adenopathy or tenderness bilaterally. Examined supine and standing with a variety of maneuvers.  Musculoskeletal: She exhibits no edema and no tenderness.  Lymphadenopathy:    She has no cervical adenopathy.  Neurological: She is alert and oriented to person, place, and time. She exhibits normal muscle tone. Coordination normal.  Skin: Skin is warm. No rash noted. She is not diaphoretic. No erythema.  No pallor.  Psychiatric: She has a normal mood and affect. Her behavior is normal. Judgment and thought content normal.    Data Reviewed Old operative records from [facility]. Dr.  Huel Coventry office notes  Assessment   Left groin pain of uncertain etiology. History is consistent with musculoskeletal etiology. Cannot rule out small  hernia, but less likely.  History multiple abdominal operations  Hypothyroidism  Mild obesity    Plan    I discussed the uncertainty of the diagnosis. I suggested possible strategies for further evaluation if she desired. We discussed the use of CT scanning as well as orthopedic referral.  She stated she would like to wait and see since things seem to be getting a   better. I told her this was reasonable. She was advised to use ibuprofen for pain. She was encouraged to call me or return to see me if she develops progressive pain or a bulge.       Angelia Mould. Derrell Lolling, M.D.,  [facility] General and Minimally invasive Surgery Breast and Colorectal Surgery Office:   [phone] Pager:   [phone]  07/08/2011, 9:52 AM

## 2011-07-08 NOTE — Patient Instructions (Signed)
We have discussed the possible causes of your intermittent left groin pain. We have discussed different strategies for evaluation, including orthopedic referral and CT scan.  At this time, since it appears to be getting better, you have stated that you would like to wait and see.  Please call Dr. Derrell Lolling if the pain persists or gets worse, and we will take next steps.  Use Advil or Tylenol for the pain.

## 2013-02-12 ENCOUNTER — Other Ambulatory Visit: Payer: Self-pay | Admitting: Gastroenterology

## 2014-05-05 ENCOUNTER — Telehealth: Payer: Self-pay | Admitting: Internal Medicine

## 2014-05-05 NOTE — Telephone Encounter (Signed)
[facility] [place] for appointment with Dr Rennis GoldenHilty on 06/07/14.  [facility] Dr Blanchie DessertHilty's schedule on 06/07/14.  lp

## 2014-06-07 ENCOUNTER — Ambulatory Visit: Payer: Self-pay | Admitting: Internal Medicine

## 2014-08-05 ENCOUNTER — Ambulatory Visit: Payer: Self-pay | Admitting: Internal Medicine

## 2015-01-23 ENCOUNTER — Encounter (HOSPITAL_BASED_OUTPATIENT_CLINIC_OR_DEPARTMENT_OTHER): Payer: Self-pay | Admitting: *Deleted

## 2015-01-23 ENCOUNTER — Emergency Department (HOSPITAL_BASED_OUTPATIENT_CLINIC_OR_DEPARTMENT_OTHER): Payer: BLUE CROSS/BLUE SHIELD

## 2015-01-23 ENCOUNTER — Emergency Department (HOSPITAL_BASED_OUTPATIENT_CLINIC_OR_DEPARTMENT_OTHER)
Admission: EM | Admit: 2015-01-23 | Discharge: 2015-01-23 | Disposition: A | Discharge status: Home / Self Care | Payer: BLUE CROSS/BLUE SHIELD | Attending: Emergency Medicine | Admitting: Emergency Medicine

## 2015-01-23 DIAGNOSIS — R0602 Shortness of breath: Secondary | ICD-10-CM | POA: Diagnosis present

## 2015-01-23 DIAGNOSIS — J159 Unspecified bacterial pneumonia: Secondary | ICD-10-CM | POA: Diagnosis not present

## 2015-01-23 DIAGNOSIS — Z87891 Personal history of nicotine dependence: Secondary | ICD-10-CM | POA: Insufficient documentation

## 2015-01-23 DIAGNOSIS — Z7982 Long term (current) use of aspirin: Secondary | ICD-10-CM | POA: Diagnosis not present

## 2015-01-23 DIAGNOSIS — Z79899 Other long term (current) drug therapy: Secondary | ICD-10-CM | POA: Diagnosis not present

## 2015-01-23 DIAGNOSIS — Z8719 Personal history of other diseases of the digestive system: Secondary | ICD-10-CM | POA: Diagnosis not present

## 2015-01-23 DIAGNOSIS — E039 Hypothyroidism, unspecified: Secondary | ICD-10-CM | POA: Insufficient documentation

## 2015-01-23 DIAGNOSIS — Z87440 Personal history of urinary (tract) infections: Secondary | ICD-10-CM | POA: Insufficient documentation

## 2015-01-23 DIAGNOSIS — M810 Age-related osteoporosis without current pathological fracture: Secondary | ICD-10-CM | POA: Insufficient documentation

## 2015-01-23 DIAGNOSIS — F419 Anxiety disorder, unspecified: Secondary | ICD-10-CM | POA: Diagnosis not present

## 2015-01-23 DIAGNOSIS — J189 Pneumonia, unspecified organism: Secondary | ICD-10-CM

## 2015-01-23 LAB — CBC WITH DIFFERENTIAL/PLATELET
Basophils Absolute: 0 10*3/uL (ref 0.0–0.1)
Basophils Relative: 0 %
EOS PCT: 0 %
Eosinophils Absolute: 0 10*3/uL (ref 0.0–0.7)
HEMATOCRIT: 42.6 % (ref 36.0–46.0)
Hemoglobin: 13.9 g/dL (ref 12.0–15.0)
LYMPHS ABS: 0.8 10*3/uL (ref 0.7–4.0)
LYMPHS PCT: 9 %
MCH: 29.5 pg (ref 26.0–34.0)
MCHC: 32.6 g/dL (ref 30.0–36.0)
MCV: 90.4 fL (ref 78.0–100.0)
MONO ABS: 0.7 10*3/uL (ref 0.1–1.0)
MONOS PCT: 7 %
NEUTROS ABS: 7.9 10*3/uL — AB (ref 1.7–7.7)
Neutrophils Relative %: 84 %
PLATELETS: 250 10*3/uL (ref 150–400)
RBC: 4.71 MIL/uL (ref 3.87–5.11)
RDW: 12.9 % (ref 11.5–15.5)
WBC: 9.4 10*3/uL (ref 4.0–10.5)

## 2015-01-23 LAB — BASIC METABOLIC PANEL
ANION GAP: 12 (ref 5–15)
BUN: 14 mg/dL (ref 6–20)
CALCIUM: 9.5 mg/dL (ref 8.9–10.3)
CO2: 23 mmol/L (ref 22–32)
Chloride: 106 mmol/L (ref 101–111)
Creatinine, Ser: 0.93 mg/dL (ref 0.44–1.00)
GFR calc Af Amer: >60 mL/min (ref >60)
GLUCOSE: 130 mg/dL — AB (ref 65–99)
POTASSIUM: 3.9 mmol/L (ref 3.5–5.1)
Sodium: 141 mmol/L (ref 135–145)

## 2015-01-23 LAB — URINE MICROSCOPIC-ADD ON

## 2015-01-23 LAB — HEPATIC FUNCTION PANEL
ALBUMIN: 4.2 g/dL (ref 3.5–5.0)
ALT: 60 U/L — AB (ref 14–54)
AST: 47 U/L — AB (ref 15–41)
Alkaline Phosphatase: 113 U/L (ref 38–126)
Bilirubin, Direct: 0.1 mg/dL (ref 0.1–0.5)
Indirect Bilirubin: 0.4 mg/dL (ref 0.3–0.9)
Total Bilirubin: 0.5 mg/dL (ref 0.3–1.2)
Total Protein: 7.6 g/dL (ref 6.5–8.1)

## 2015-01-23 LAB — URINALYSIS, ROUTINE W REFLEX MICROSCOPIC
Bilirubin Urine: NEGATIVE
GLUCOSE, UA: NEGATIVE mg/dL
Ketones, ur: NEGATIVE mg/dL
LEUKOCYTES UA: NEGATIVE
Nitrite: NEGATIVE
PH: 6.5 (ref 5.0–8.0)
PROTEIN: NEGATIVE mg/dL
Specific Gravity, Urine: 1.017 (ref 1.005–1.030)

## 2015-01-23 LAB — LIPASE, BLOOD: Lipase: 20 U/L (ref 11–51)

## 2015-01-23 MED ORDER — IPRATROPIUM-ALBUTEROL 0.5-2.5 (3) MG/3ML IN SOLN
3.0000 mL | RESPIRATORY_TRACT | Status: DC
  Administered 2015-01-23: 3 mL via RESPIRATORY_TRACT
  Filled 2015-01-23: qty 3

## 2015-01-23 MED ORDER — ACETAMINOPHEN 325 MG PO TABS
975.0000 mg | ORAL_TABLET | Freq: Once | ORAL | Status: AC
  Administered 2015-01-23: 975 mg via ORAL
  Filled 2015-01-23: qty 3

## 2015-01-23 MED ORDER — IBUPROFEN 200 MG PO TABS
600.0000 mg | ORAL_TABLET | Freq: Once | ORAL | Status: AC
  Administered 2015-01-23: 600 mg via ORAL
  Filled 2015-01-23: qty 1

## 2015-01-23 MED ORDER — AZITHROMYCIN 250 MG PO TABS: 250.0000 mg | ORAL_TABLET | Freq: Every day | ORAL | Status: AC

## 2015-01-23 MED ORDER — AZITHROMYCIN 250 MG PO TABS
500.0000 mg | ORAL_TABLET | Freq: Once | ORAL | Status: AC
  Administered 2015-01-23: 500 mg via ORAL
  Filled 2015-01-23: qty 2

## 2015-01-23 NOTE — ED Notes (Signed)
[facility], NAD, calm, interactive, resps e/u, some increased wob with exertion, speaking in clear phrases[facility]

## 2015-01-23 NOTE — Discharge Instructions (Signed)
Community-Acquired Pneumonia, Adult Theresa Rich, you have a small pneumonia in your lungs.  Take antibiotics as directed and see your primary care doctor within 3 days for close follow up.  Take tylenol at home for fever.  If any symptoms worsen, come back to the ED Immediately. Thank you.   Pneumonia is an infection of the lungs. One type of pneumonia can happen while a person is in a hospital. A different type can happen when a person is not in a hospital (community-acquired pneumonia). It is easy for this kind to spread from person to person. It can spread to you if you breathe near an infected person who coughs or sneezes. Some symptoms include:  A dry cough.  A wet (productive) cough.  Fever.  Sweating.  Chest pain. HOME CARE  Take over-the-counter and prescription medicines only as told by your doctor.  Only take cough medicine if you are losing sleep.  If you were prescribed an antibiotic medicine, take it as told by your doctor. Do not stop taking the antibiotic even if you start to feel better.  Sleep with your head and neck raised (elevated). You can do this by putting a few pillows under your head, or you can sleep in a recliner.  Do not use tobacco products. These include cigarettes, chewing tobacco, and e-cigarettes. If you need help quitting, ask your doctor.  Drink enough water to keep your pee (urine) clear or pale yellow. A shot (vaccine) can help prevent pneumonia. Shots are often suggested for:  People older than 57 years of age.  People older than 57 years of age:  Who are having cancer treatment.  Who have long-term (chronic) lung disease.  Who have problems with their body's defense system (immune system). You may also prevent pneumonia if you take these actions:  Get the flu (influenza) shot every year.  Go to the dentist as often as told.  Wash your hands often. If soap and water are not available, use hand sanitizer. GET HELP IF:  You have a  fever.  You lose sleep because your cough medicine does not help. GET HELP RIGHT AWAY IF:  You are short of breath and it gets worse.  You have more chest pain.  Your sickness gets worse. This is very serious if:  You are an older adult.  Your body's defense system is weak.  You cough up blood.   This information is not intended to replace advice given to you by your health care provider. Make sure you discuss any questions you have with your health care provider.   Document Released: 06/19/2007 Document Revised: 09/21/2014 Document Reviewed: 04/27/2014 Elsevier Interactive Patient Education [employer]2016 [employer] Inc.

## 2015-01-23 NOTE — ED Notes (Signed)
Now mentions some LLQ discomfort, and L back discomfort. Urinalysis added per protocol.

## 2015-01-23 NOTE — ED Provider Notes (Addendum)
CSN: [id number]     Arrival date & time 01/23/15  0511 History   First MD Initiated Contact with Patient 01/23/15 [phone]     Chief Complaint  Patient presents with  . Shortness of Breath     (Consider location/radiation/quality/duration/timing/severity/associated sxs/prior Treatment) HPI   Theresa Rich is a 55 y.o. female with no sig PMH, here with viral URI symptoms for the past 2-3 days.  She has multiple sick contacts in her son and husband who have similar symptoms, but they have improved.  She describes runny nose, congestion, and dry cough.  She had a fever of 102 at home as well.  She then experienced chest tightness this morning and came to the ED for worsening SOB when going to bed tonight. She states that sitting up did not help.  She denies any LE edema or h/o CHF.  Patient finally describes BLQ abdominal pain and suprapubic pain.  This has been going on for over 1 week.  She has chronic L flank pain as well.  She denies vomiting or diarrhea.  She has no urinary symptoms or changes in her bowl movements.  There are no further concerns.  10 Systems reviewed and are negative for acute change except as noted in the HPI.    Past Medical History  Diagnosis Date  . Anxiety   . Thyroid disease     hypothyroid  . Incisional hernia, incarcerated   . Bowel obstruction (HCC)   . Abdominal pain   . UTI (urinary tract infection)   . Osteoporosis    Past Surgical History  Procedure Laterality Date  . Bladder suspension      with mesh  . Abdominal surgery    . Laparoscopic appendectomy  11/21/2010    Procedure: APPENDECTOMY LAPAROSCOPIC;  Surgeon: Adolph Pollack, MD;  Location: [facility];  Service: General;  Laterality: N/A;  . Abdominal hysterectomy  2007  . Appendectomy  2012  . Hernia repair      incisional with SB resection  . Hernia repair  1960    umbilical as a child - six weeks of age  . Cesarean section  1990, 1995   Family History  Problem Relation Age of Onset  . Heart  disease Mother   . Hypertension Mother   . Heart disease Father   . Hypertension Father   . Hypertension Sister   . Cancer Paternal Aunt     pt unaware of what kind   Social History  Substance Use Topics  . Smoking status: Former Games developer  . Smokeless tobacco: Former Neurosurgeon    Quit date: 11/20/2005  . Alcohol Use: No   OB History    No data available     Review of Systems    Allergies  Reglan  Home Medications   Prior to Admission medications   Medication Sig Start Date End Date Taking? Authorizing Provider  Atorvastatin Calcium (LIPITOR PO) Take by mouth.   Yes Historical Provider, MD  aspirin 81 MG tablet Take 1 tablet (81 mg total) by mouth daily. 11/22/10   Brayton El, PA-C  escitalopram (LEXAPRO) 20 MG tablet Take 1 tablet (20 mg total) by mouth daily. 11/22/10   Brayton El, PA-C  estradiol (VIVELLE-DOT) 0.1 MG/24HR Place 1 patch (0.1 mg total) onto the skin 2 (two) times a week. 11/22/10   Brayton El, PA-C  levothyroxine (SYNTHROID, LEVOTHROID) 75 MCG tablet Take 1 tablet (75 mcg total) by mouth daily. 11/22/10   Brayton El, PA-C  rosuvastatin (CRESTOR) 10 MG tablet Take 1 tablet (10 mg total) by mouth daily. 11/22/10   Brayton El, PA-C   BP 147/72 mmHg  Pulse 94  Temp(Src) 102 F (38.9 C) (Oral)  Resp 20  Ht 5\' 2"  (1.575 m)  Wt 200 lb (90.719 kg)  BMI 36.57 kg/m2  SpO2 96% Physical Exam  Constitutional: She is oriented to person, place, and time. She appears well-developed and well-nourished. No distress.  HENT:  Head: Normocephalic and atraumatic.  Nose: Nose normal.  Mouth/Throat: Oropharynx is clear and moist. No oropharyngeal exudate.  Eyes: Conjunctivae and EOM are normal. Pupils are equal, round, and reactive to light. No scleral icterus.  Neck: Normal range of motion. Neck supple. No JVD present. No tracheal deviation present. No thyromegaly present.  Cardiovascular: Normal rate, regular rhythm and normal heart sounds.  Exam reveals no gallop  and no friction rub.   No murmur heard. Pulmonary/Chest: Effort normal and breath sounds normal. No respiratory distress. She has no wheezes. She exhibits no tenderness.  Dec BS on the left  Abdominal: Soft. Bowel sounds are normal. She exhibits no distension and no mass. There is no tenderness. There is no rebound and no guarding.  Musculoskeletal: Normal range of motion. She exhibits no edema or tenderness.  Lymphadenopathy:    She has no cervical adenopathy.  Neurological: She is alert and oriented to person, place, and time. No cranial nerve deficit. She exhibits normal muscle tone.  Skin: Skin is warm and dry. No rash noted. No erythema. No pallor.  Tactile fever  Nursing note and vitals reviewed.   ED Course  Procedures (including critical care time) Labs Review Labs Reviewed  CBC WITH DIFFERENTIAL/PLATELET - Abnormal; Notable for the following:    Neutro Abs 7.9 (*)    All other components within normal limits  BASIC METABOLIC PANEL - Abnormal; Notable for the following:    Glucose, Bld 130 (*)    All other components within normal limits  URINALYSIS, ROUTINE W REFLEX MICROSCOPIC (NOT AT [facility]) - Abnormal; Notable for the following:    Hgb urine dipstick SMALL (*)    All other components within normal limits  URINE MICROSCOPIC-ADD ON - Abnormal; Notable for the following:    Squamous Epithelial / LPF 0-5 (*)    Bacteria, UA FEW (*)    All other components within normal limits  HEPATIC FUNCTION PANEL  LIPASE, BLOOD    Imaging Review Dg Chest 2 View  01/23/2015  CLINICAL DATA:  55 year old female with cough, fever, and shortness of breath. EXAM: CHEST  2 VIEW COMPARISON:  None. FINDINGS: The heart size and mediastinal contours are within normal limits. Both lungs are clear. The visualized skeletal structures are unremarkable. IMPRESSION: No active cardiopulmonary disease. Electronically Signed   By: Elgie Collard M.D.   On: 01/23/2015 05:36   I have personally reviewed  and evaluated these images and lab results as part of my medical decision-making.   EKG Interpretation None      MDM   Final diagnoses:  None    Patient presents to the ED for fever, cough, and SOB.  H/o is consistent with CAP but CXR is negative.  She has sick contacts as well. CAP can be missed by CXR and I think it will be best to treat her and have her fu with her PCP within 3 days.  She was given duoneb and azithromycin in the ED.  She was given tylenol and ibuprofen for her fever of 102.1.  Tachycardia resolved with treatment.  I do not believe her abdominal pain is related to her fever, cough and SOB.  She tells me it is an intermittent pain that has been going on for some time.    I repeated her temperature and it was 99.8 after tylenol and ibuprofen.  Labs are unremarkable.  She will be DC wih 4 more days of azithro.  She appears well and in NAD.  Fever has resolved with medications.  Patient is safe for DC with PCP fu within 3 days.  Tomasita CrumbleAdeleke Sargun Rummell, MD 01/23/15 [phone]

## 2015-01-23 NOTE — ED Notes (Signed)
C/o cough, fever, and sob, also HA. Describes cough as non-productive. Alert, NAD, calm, interactive, resps e/u, mildly labored with exertion, LS CTA, diminished. Admits to nausea. (denies: vd). Taking OTC med w/o relief. Onset yesterday.

## 2015-01-23 NOTE — ED Notes (Signed)
Back from b/r, Dr. Mora Bellmanni into room.

## 2015-06-13 DIAGNOSIS — Z6836 Body mass index (BMI) 36.0-36.9, adult: Secondary | ICD-10-CM | POA: Diagnosis not present

## 2015-06-13 DIAGNOSIS — E782 Mixed hyperlipidemia: Secondary | ICD-10-CM | POA: Diagnosis not present

## 2015-06-13 DIAGNOSIS — E039 Hypothyroidism, unspecified: Secondary | ICD-10-CM | POA: Diagnosis not present

## 2015-06-13 DIAGNOSIS — E559 Vitamin D deficiency, unspecified: Secondary | ICD-10-CM | POA: Diagnosis not present

## 2015-06-20 DIAGNOSIS — H2513 Age-related nuclear cataract, bilateral: Secondary | ICD-10-CM | POA: Diagnosis not present

## 2015-07-17 DIAGNOSIS — G4733 Obstructive sleep apnea (adult) (pediatric): Secondary | ICD-10-CM | POA: Diagnosis not present

## 2015-09-20 DIAGNOSIS — F320 Major depressive disorder, single episode, mild: Secondary | ICD-10-CM | POA: Diagnosis not present

## 2015-10-19 DIAGNOSIS — E2839 Other primary ovarian failure: Secondary | ICD-10-CM | POA: Diagnosis not present

## 2016-02-08 DIAGNOSIS — R109 Unspecified abdominal pain: Secondary | ICD-10-CM | POA: Diagnosis not present

## 2016-02-08 DIAGNOSIS — R300 Dysuria: Secondary | ICD-10-CM | POA: Diagnosis not present

## 2016-02-21 ENCOUNTER — Other Ambulatory Visit: Payer: Self-pay | Admitting: Family Medicine

## 2016-02-27 ENCOUNTER — Other Ambulatory Visit: Payer: Self-pay | Admitting: Family Medicine

## 2016-02-27 DIAGNOSIS — R102 Pelvic and perineal pain: Secondary | ICD-10-CM

## 2016-02-27 DIAGNOSIS — R1084 Generalized abdominal pain: Secondary | ICD-10-CM

## 2016-03-06 ENCOUNTER — Ambulatory Visit
Admission: RE | Admit: 2016-03-06 | Discharge: 2016-03-06 | Disposition: A | Discharge status: Home / Self Care | Payer: BLUE CROSS/BLUE SHIELD | Source: Ambulatory Visit | Attending: Family Medicine | Admitting: Family Medicine

## 2016-03-06 DIAGNOSIS — R1084 Generalized abdominal pain: Secondary | ICD-10-CM

## 2016-03-06 DIAGNOSIS — R102 Pelvic and perineal pain: Secondary | ICD-10-CM

## 2017-02-28 DIAGNOSIS — E039 Hypothyroidism, unspecified: Secondary | ICD-10-CM | POA: Diagnosis not present

## 2017-02-28 DIAGNOSIS — F320 Major depressive disorder, single episode, mild: Secondary | ICD-10-CM | POA: Diagnosis not present

## 2017-02-28 DIAGNOSIS — E782 Mixed hyperlipidemia: Secondary | ICD-10-CM | POA: Diagnosis not present

## 2017-03-16 DIAGNOSIS — J180 Bronchopneumonia, unspecified organism: Secondary | ICD-10-CM | POA: Diagnosis not present

## 2017-03-16 DIAGNOSIS — N390 Urinary tract infection, site not specified: Secondary | ICD-10-CM | POA: Diagnosis not present

## 2017-04-22 DIAGNOSIS — B372 Candidiasis of skin and nail: Secondary | ICD-10-CM | POA: Diagnosis not present

## 2017-04-22 DIAGNOSIS — R21 Rash and other nonspecific skin eruption: Secondary | ICD-10-CM | POA: Diagnosis not present

## 2017-05-28 DIAGNOSIS — R7989 Other specified abnormal findings of blood chemistry: Secondary | ICD-10-CM | POA: Diagnosis not present

## 2017-05-28 DIAGNOSIS — R946 Abnormal results of thyroid function studies: Secondary | ICD-10-CM | POA: Diagnosis not present

## 2017-05-31 IMAGING — CR DG CHEST 2V
2 series · 2 of 2 positions shown · non-contrast
Comparison: None.

CLINICAL DATA: 54-year-old female with cough, fever, and shortness
of breath.

EXAM:
CHEST  2 VIEW

[w chest pa]
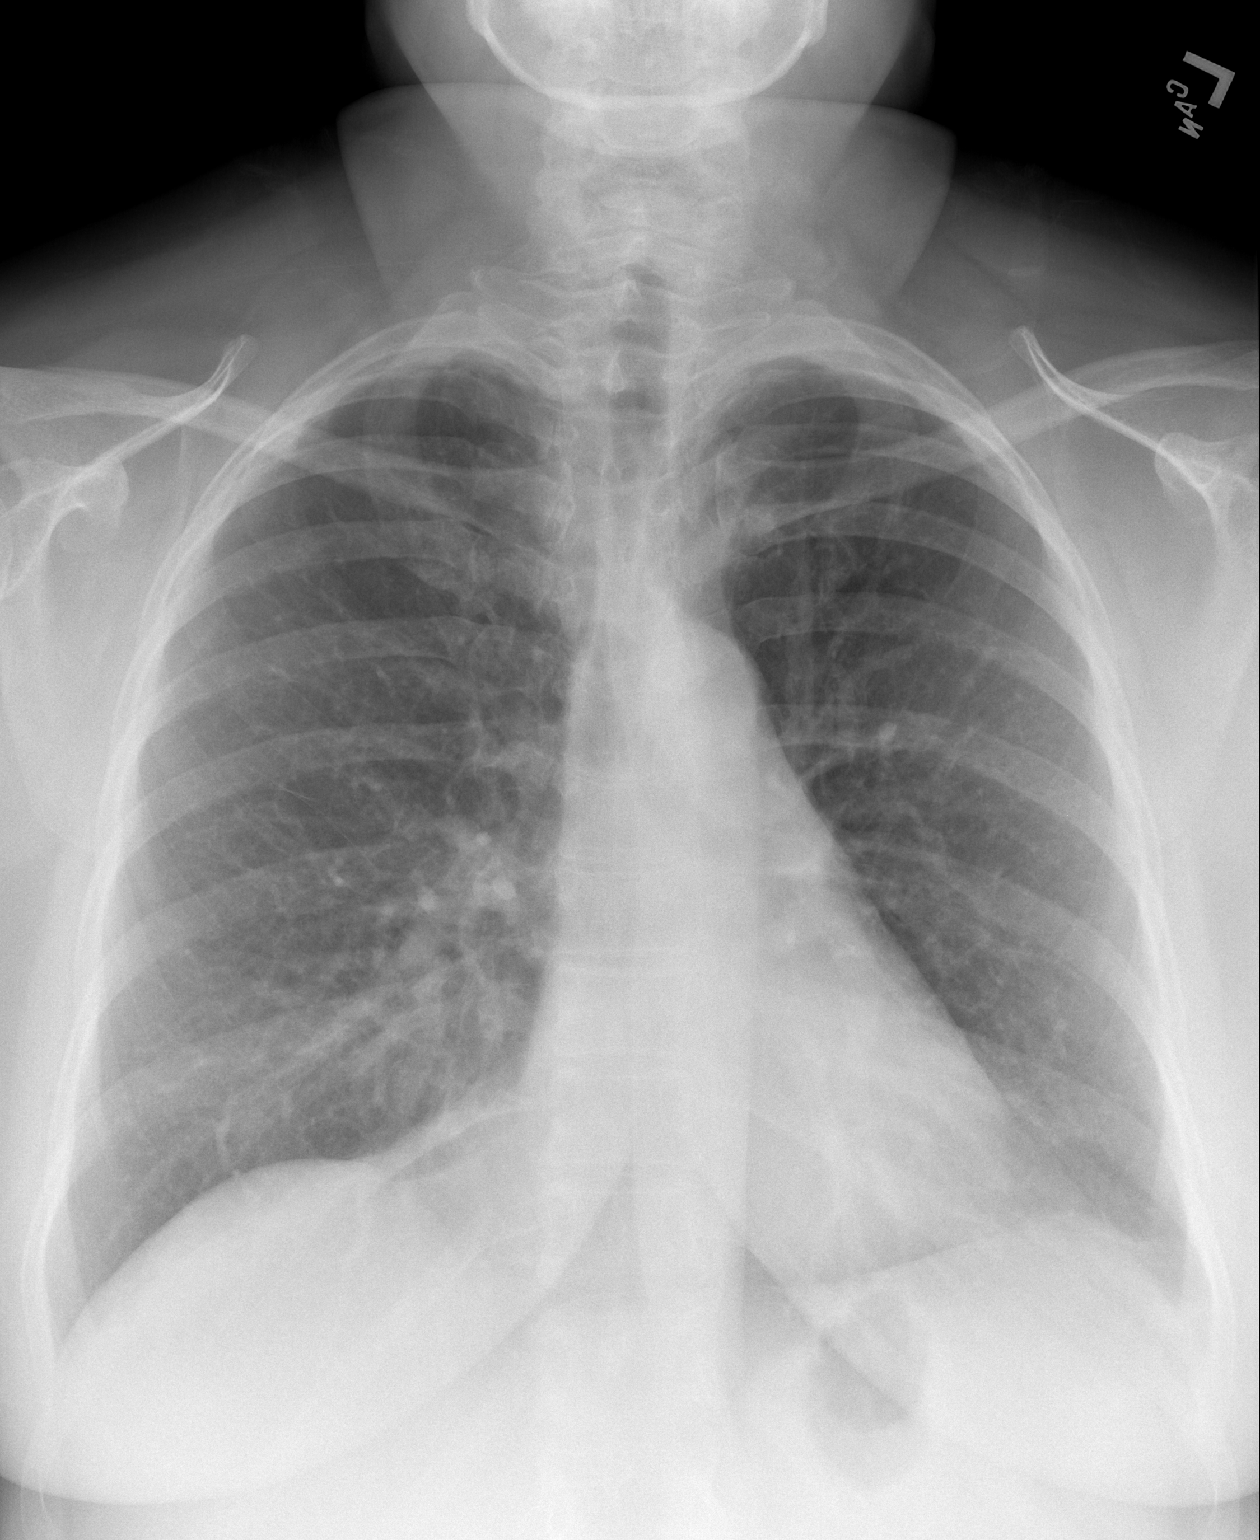

[w chest lat]
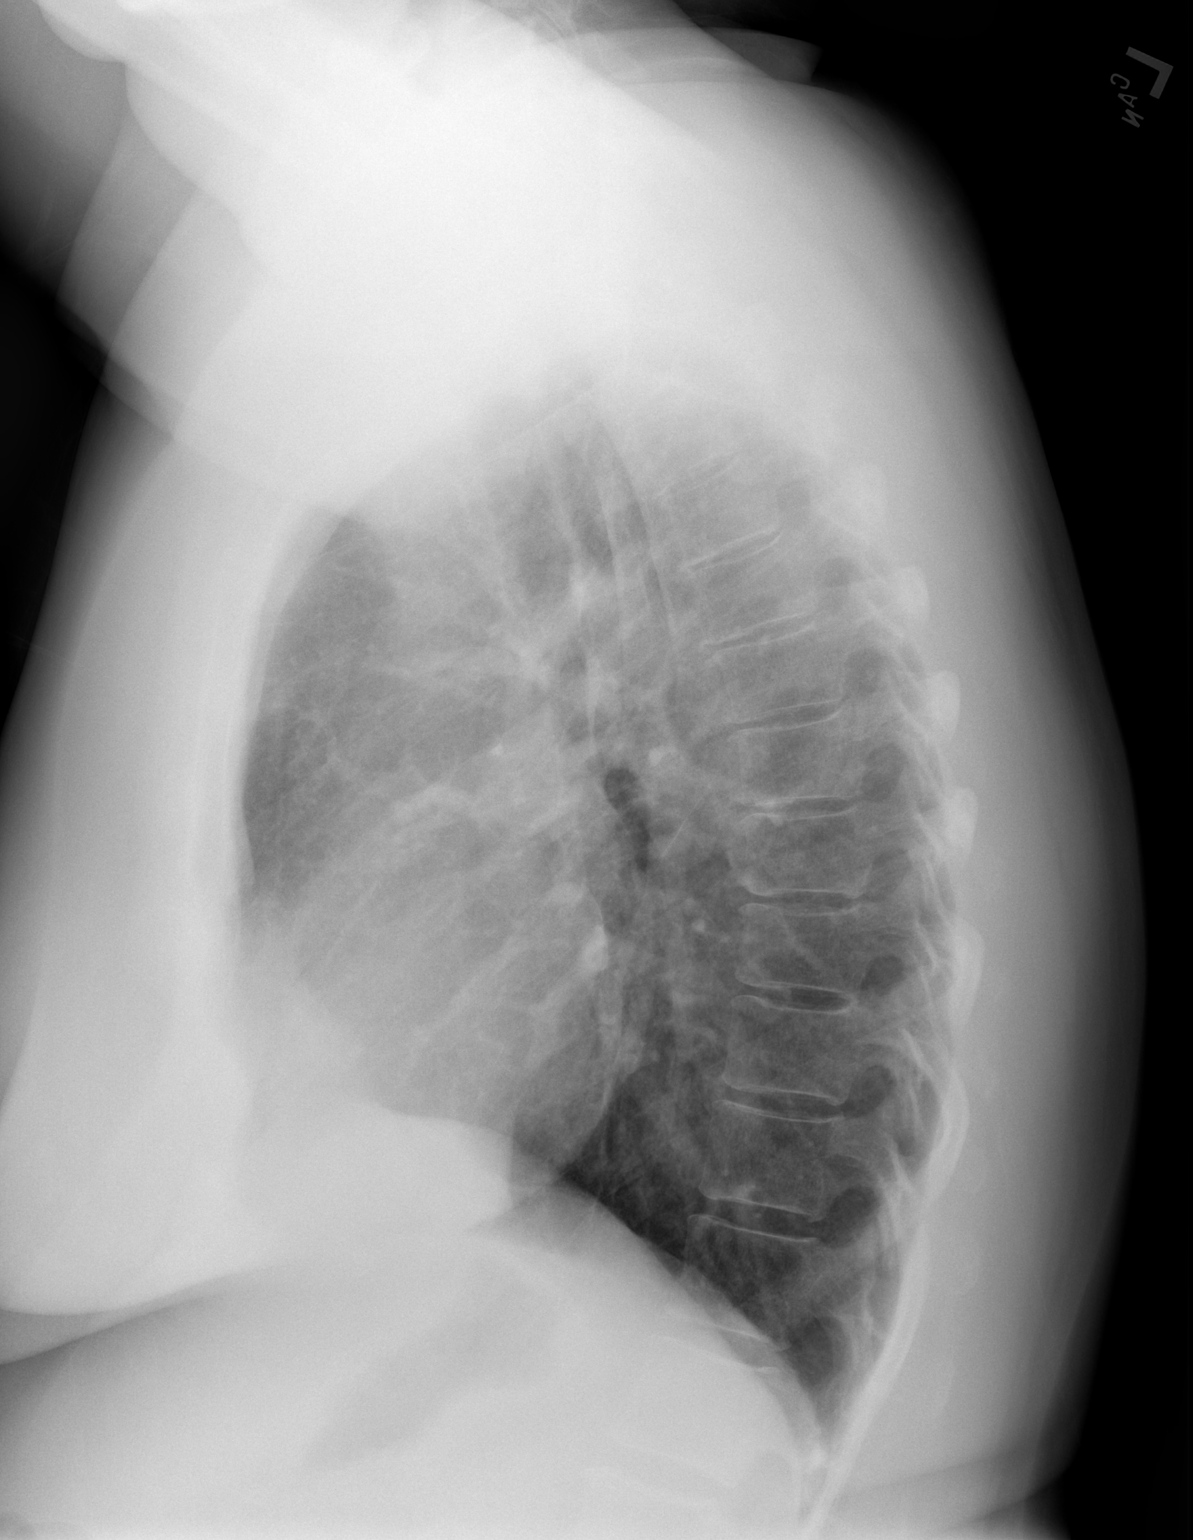

[2 of 2 positions shown; findings below may reference images not displayed]

FINDINGS: The heart size and mediastinal contours are within normal limits.
Both lungs are clear. The visualized skeletal structures are
unremarkable.
IMPRESSION: No active cardiopulmonary disease.

## 2017-08-28 DIAGNOSIS — E039 Hypothyroidism, unspecified: Secondary | ICD-10-CM | POA: Diagnosis not present

## 2017-08-28 DIAGNOSIS — F320 Major depressive disorder, single episode, mild: Secondary | ICD-10-CM | POA: Diagnosis not present

## 2017-08-28 DIAGNOSIS — E782 Mixed hyperlipidemia: Secondary | ICD-10-CM | POA: Diagnosis not present

## 2017-08-28 DIAGNOSIS — R8290 Unspecified abnormal findings in urine: Secondary | ICD-10-CM | POA: Diagnosis not present

## 2017-09-23 DIAGNOSIS — I8312 Varicose veins of left lower extremity with inflammation: Secondary | ICD-10-CM | POA: Diagnosis not present

## 2017-09-23 DIAGNOSIS — D485 Neoplasm of uncertain behavior of skin: Secondary | ICD-10-CM | POA: Diagnosis not present

## 2017-09-23 DIAGNOSIS — L304 Erythema intertrigo: Secondary | ICD-10-CM | POA: Diagnosis not present

## 2017-09-23 DIAGNOSIS — I8311 Varicose veins of right lower extremity with inflammation: Secondary | ICD-10-CM | POA: Diagnosis not present

## 2017-11-17 DIAGNOSIS — L439 Lichen planus, unspecified: Secondary | ICD-10-CM | POA: Diagnosis not present

## 2017-11-17 DIAGNOSIS — D485 Neoplasm of uncertain behavior of skin: Secondary | ICD-10-CM | POA: Diagnosis not present

## 2017-11-17 DIAGNOSIS — L918 Other hypertrophic disorders of the skin: Secondary | ICD-10-CM | POA: Diagnosis not present

## 2017-12-15 DIAGNOSIS — Z23 Encounter for immunization: Secondary | ICD-10-CM | POA: Diagnosis not present

## 2018-05-15 IMAGING — US US TRANSVAGINAL NON-OB
1 series · 14 of 19 positions shown · non-contrast
Comparison: None

CLINICAL DATA: Initial evaluation for lower back pain, bladder
pressure.

EXAM:
TRANSABDOMINAL AND TRANSVAGINAL ULTRASOUND OF PELVIS
TECHNIQUE: Both transabdominal and transvaginal ultrasound examinations of the
pelvis were performed. Transabdominal technique was performed for
global imaging of the pelvis including uterus, ovaries, adnexal
regions, and pelvic cul-de-sac. It was necessary to proceed with
endovaginal exam following the transabdominal exam to visualize the
pelvic structures.

[Series 1: us transvaginal non-ob · 0.22mm/px · 14 of 19 slices shown]
[im 1/19]
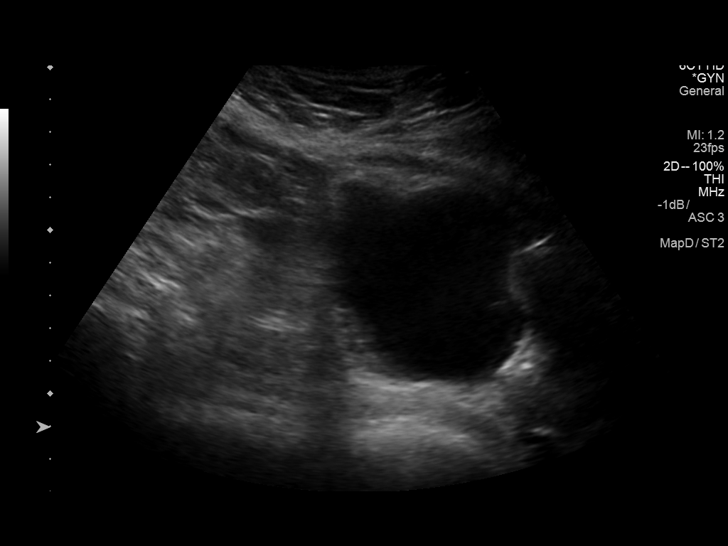
[im 3/19]
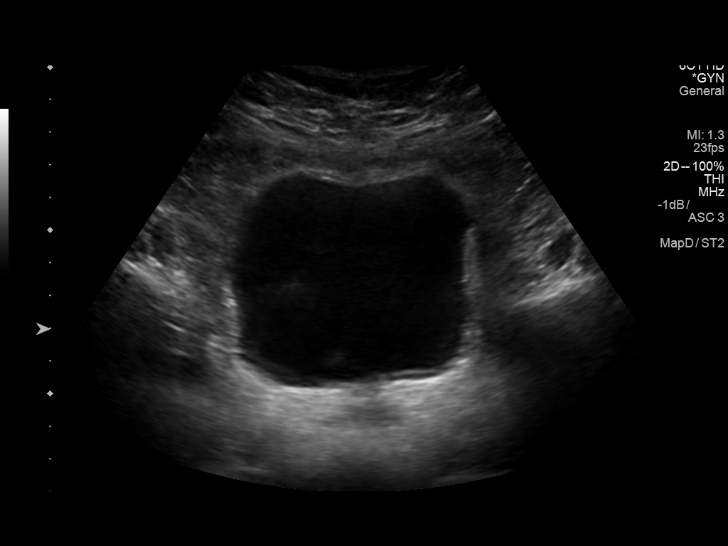
[im 4/19]
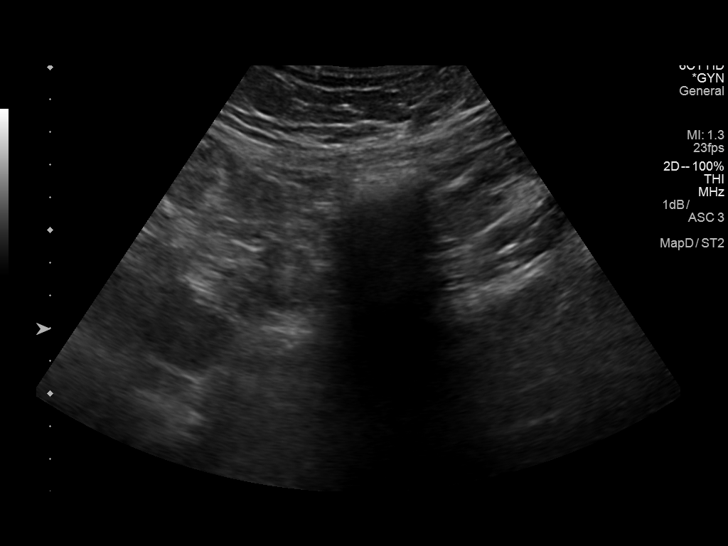
[im 5/19]
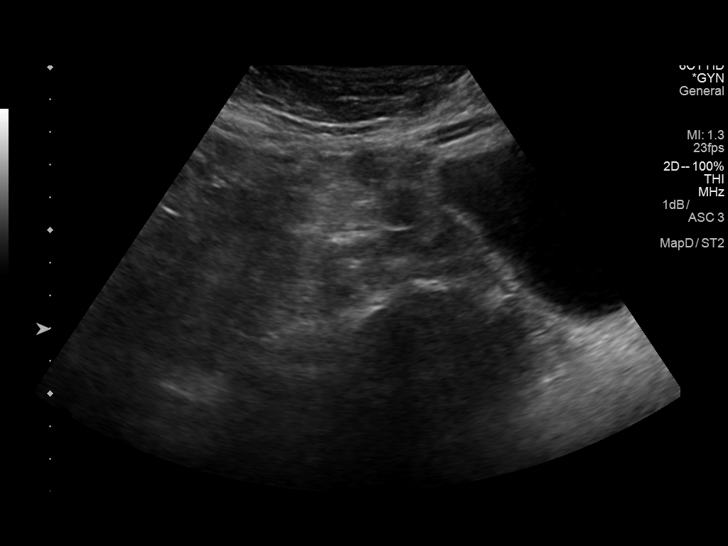
[im 7/19]
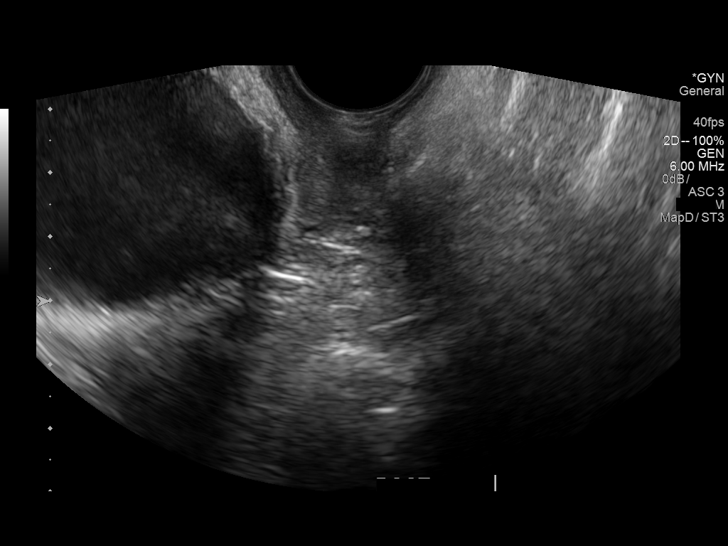
[im 8/19]
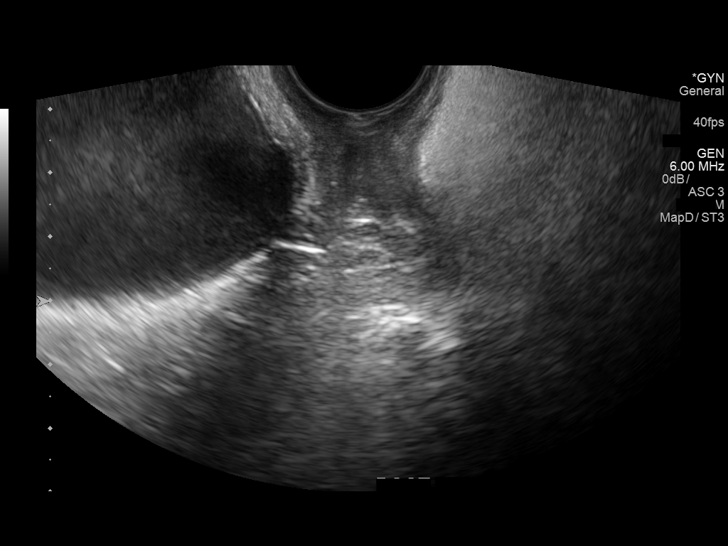
[im 9/19]
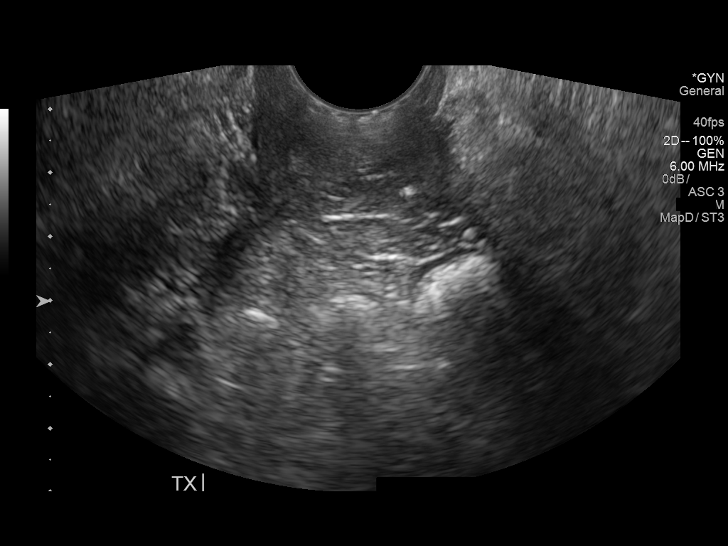
[im 11/19]
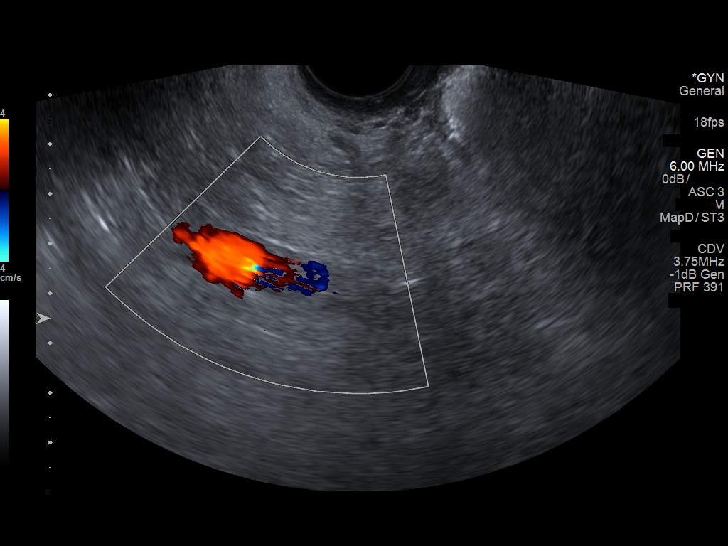
[im 12/19]
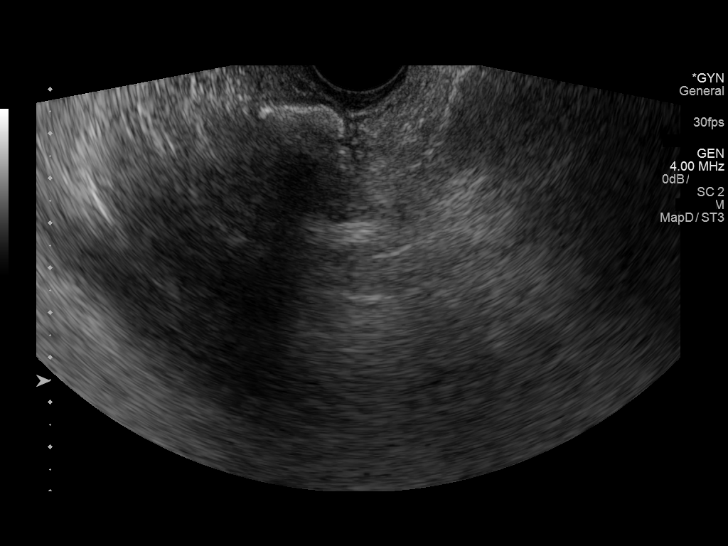
[im 13/19]
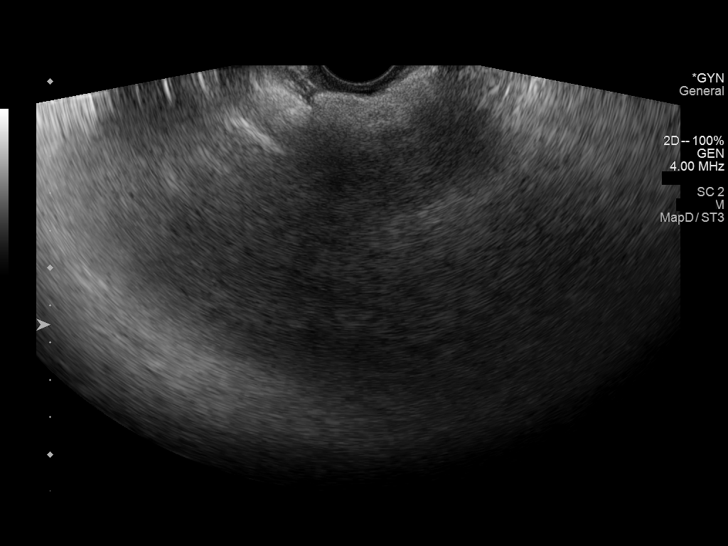
[im 15/19]
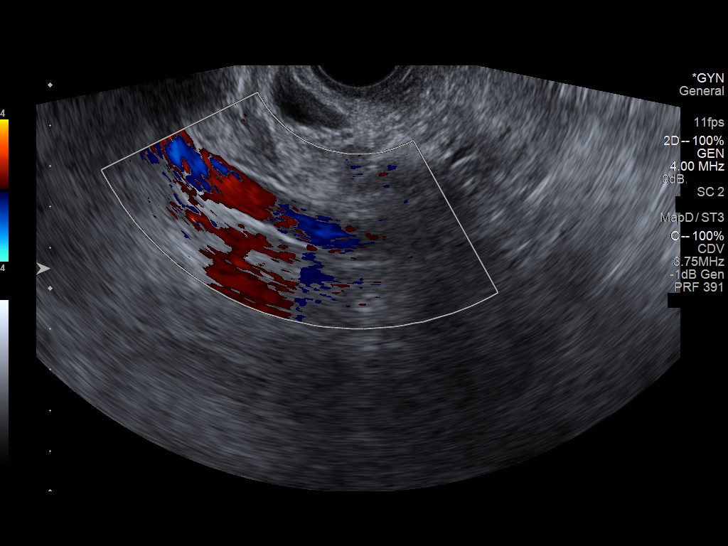
[im 16/19]
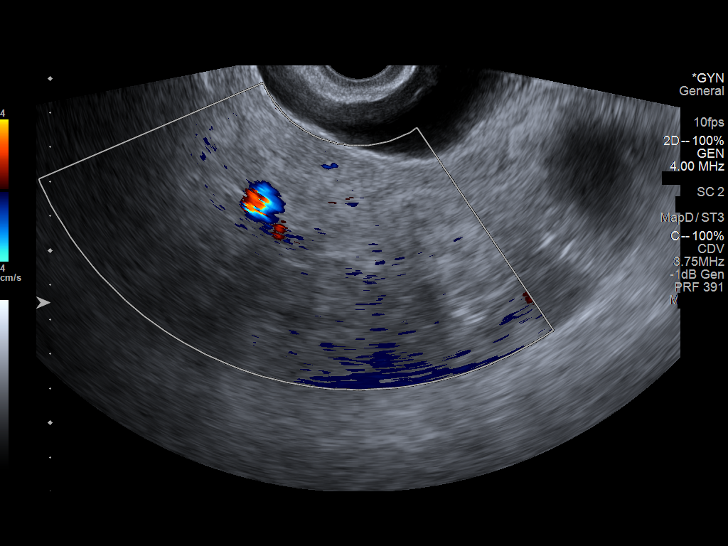
[im 17/19]
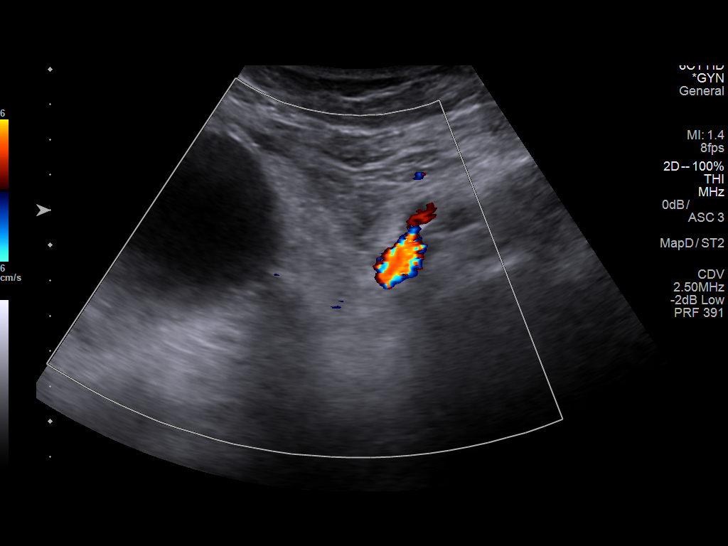
[im 19/19]
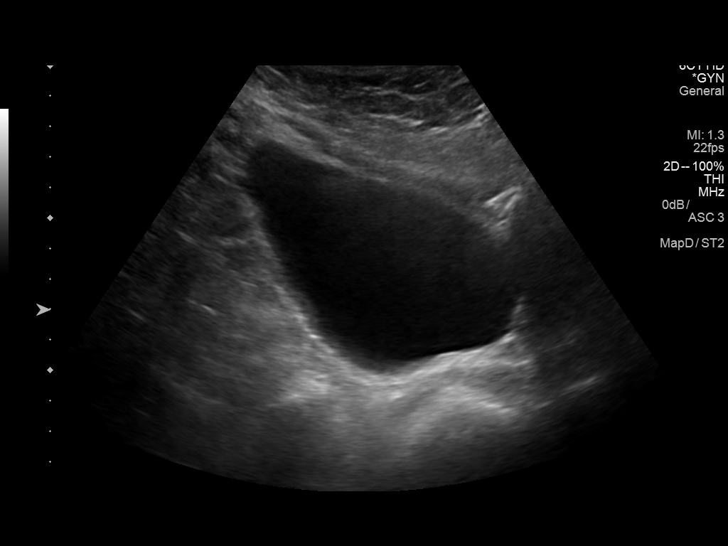

[14 of 19 positions shown; findings below may reference images not displayed]

FINDINGS: Uterus

Surgically absent.  Vaginal cuff within normal limits.

Endometrium

Surgically absent.

Right ovary

Surgically absent.

Left ovary

Surgically absent.

Other findings

No other significant abnormal findings.
IMPRESSION: Sequelae of prior hysterectomy and bilateral salpingo-oophorectomy.
Vaginal cuff within normal limits. No other abnormality identified
within the pelvis.

## 2022-03-11 DIAGNOSIS — G4733 Obstructive sleep apnea (adult) (pediatric): Secondary | ICD-10-CM | POA: Diagnosis not present

## 2022-03-15 DIAGNOSIS — H40013 Open angle with borderline findings, low risk, bilateral: Secondary | ICD-10-CM | POA: Diagnosis not present

## 2022-03-15 DIAGNOSIS — H2513 Age-related nuclear cataract, bilateral: Secondary | ICD-10-CM | POA: Diagnosis not present

## 2022-03-28 DIAGNOSIS — H40013 Open angle with borderline findings, low risk, bilateral: Secondary | ICD-10-CM | POA: Diagnosis not present

## 2022-03-28 DIAGNOSIS — H2513 Age-related nuclear cataract, bilateral: Secondary | ICD-10-CM | POA: Diagnosis not present

## 2022-03-28 DIAGNOSIS — H5213 Myopia, bilateral: Secondary | ICD-10-CM | POA: Diagnosis not present

## 2022-03-28 DIAGNOSIS — H524 Presbyopia: Secondary | ICD-10-CM | POA: Diagnosis not present

## 2022-03-28 DIAGNOSIS — H04123 Dry eye syndrome of bilateral lacrimal glands: Secondary | ICD-10-CM | POA: Diagnosis not present

## 2022-04-01 DIAGNOSIS — E669 Obesity, unspecified: Secondary | ICD-10-CM | POA: Diagnosis not present

## 2022-04-01 DIAGNOSIS — E039 Hypothyroidism, unspecified: Secondary | ICD-10-CM | POA: Diagnosis not present

## 2022-04-01 DIAGNOSIS — R7303 Prediabetes: Secondary | ICD-10-CM | POA: Diagnosis not present

## 2022-04-01 DIAGNOSIS — I10 Essential (primary) hypertension: Secondary | ICD-10-CM | POA: Diagnosis not present

## 2022-04-01 DIAGNOSIS — E782 Mixed hyperlipidemia: Secondary | ICD-10-CM | POA: Diagnosis not present

## 2022-04-01 DIAGNOSIS — F320 Major depressive disorder, single episode, mild: Secondary | ICD-10-CM | POA: Diagnosis not present

## 2022-04-09 DIAGNOSIS — G4733 Obstructive sleep apnea (adult) (pediatric): Secondary | ICD-10-CM | POA: Diagnosis not present

## 2022-05-10 DIAGNOSIS — G4733 Obstructive sleep apnea (adult) (pediatric): Secondary | ICD-10-CM | POA: Diagnosis not present

## 2022-05-28 DIAGNOSIS — G4733 Obstructive sleep apnea (adult) (pediatric): Secondary | ICD-10-CM | POA: Diagnosis not present

## 2022-06-09 DIAGNOSIS — G4733 Obstructive sleep apnea (adult) (pediatric): Secondary | ICD-10-CM | POA: Diagnosis not present

## 2022-07-10 DIAGNOSIS — G4733 Obstructive sleep apnea (adult) (pediatric): Secondary | ICD-10-CM | POA: Diagnosis not present

## 2022-08-09 DIAGNOSIS — G4733 Obstructive sleep apnea (adult) (pediatric): Secondary | ICD-10-CM | POA: Diagnosis not present

## 2022-09-09 DIAGNOSIS — G4733 Obstructive sleep apnea (adult) (pediatric): Secondary | ICD-10-CM | POA: Diagnosis not present

## 2022-10-10 DIAGNOSIS — G4733 Obstructive sleep apnea (adult) (pediatric): Secondary | ICD-10-CM | POA: Diagnosis not present

## 2022-10-12 DIAGNOSIS — U071 COVID-19: Secondary | ICD-10-CM | POA: Diagnosis not present

## 2022-11-07 ENCOUNTER — Other Ambulatory Visit: Payer: Self-pay | Admitting: Family Medicine

## 2022-11-07 DIAGNOSIS — Z1231 Encounter for screening mammogram for malignant neoplasm of breast: Secondary | ICD-10-CM

## 2022-11-09 DIAGNOSIS — G4733 Obstructive sleep apnea (adult) (pediatric): Secondary | ICD-10-CM | POA: Diagnosis not present

## 2022-11-18 DIAGNOSIS — Z0000 Encounter for general adult medical examination without abnormal findings: Secondary | ICD-10-CM | POA: Diagnosis not present

## 2022-11-18 DIAGNOSIS — F320 Major depressive disorder, single episode, mild: Secondary | ICD-10-CM | POA: Diagnosis not present

## 2022-11-18 DIAGNOSIS — E782 Mixed hyperlipidemia: Secondary | ICD-10-CM | POA: Diagnosis not present

## 2022-11-18 DIAGNOSIS — E039 Hypothyroidism, unspecified: Secondary | ICD-10-CM | POA: Diagnosis not present

## 2022-11-18 DIAGNOSIS — R7303 Prediabetes: Secondary | ICD-10-CM | POA: Diagnosis not present

## 2022-12-03 ENCOUNTER — Ambulatory Visit: Payer: BLUE CROSS/BLUE SHIELD

## 2022-12-10 DIAGNOSIS — G4733 Obstructive sleep apnea (adult) (pediatric): Secondary | ICD-10-CM | POA: Diagnosis not present

## 2023-01-01 ENCOUNTER — Ambulatory Visit: Payer: BLUE CROSS/BLUE SHIELD

## 2023-01-24 ENCOUNTER — Ambulatory Visit: Payer: BLUE CROSS/BLUE SHIELD

## 2023-02-11 ENCOUNTER — Ambulatory Visit
Admission: RE | Admit: 2023-02-11 | Discharge: 2023-02-11 | Disposition: A | Discharge status: Home / Self Care | Payer: Commercial Managed Care - HMO | Source: Ambulatory Visit | Attending: Family Medicine | Admitting: Family Medicine

## 2023-02-11 DIAGNOSIS — Z1231 Encounter for screening mammogram for malignant neoplasm of breast: Secondary | ICD-10-CM
